# Patient Record
Sex: Female | Born: 1950 | Race: Black or African American | Hispanic: No | Marital: Single | Smoking: Never smoker
Health system: Southern US, Community
[De-identification: ages and names within clinical notes are randomized; demographics above are authoritative.]

## PROBLEM LIST (undated history)

## (undated) DIAGNOSIS — M1990 Unspecified osteoarthritis, unspecified site: Secondary | ICD-10-CM

## (undated) DIAGNOSIS — G4730 Sleep apnea, unspecified: Secondary | ICD-10-CM

## (undated) DIAGNOSIS — E119 Type 2 diabetes mellitus without complications: Secondary | ICD-10-CM

## (undated) DIAGNOSIS — R0683 Snoring: Secondary | ICD-10-CM

## (undated) DIAGNOSIS — H269 Unspecified cataract: Secondary | ICD-10-CM

## (undated) DIAGNOSIS — R9389 Abnormal findings on diagnostic imaging of other specified body structures: Secondary | ICD-10-CM

## (undated) HISTORY — DX: Unspecified osteoarthritis, unspecified site: M19.90

## (undated) HISTORY — DX: Unspecified cataract: H26.9

## (undated) HISTORY — DX: Snoring: R06.83

## (undated) HISTORY — DX: Sleep apnea, unspecified: G47.30

---

## 1982-06-06 HISTORY — PX: OTHER SURGICAL HISTORY: SHX169

## 2016-03-24 ENCOUNTER — Encounter (HOSPITAL_COMMUNITY): Payer: Self-pay | Admitting: Family Medicine

## 2016-03-24 ENCOUNTER — Ambulatory Visit (HOSPITAL_COMMUNITY)
Admission: EM | Admit: 2016-03-24 | Discharge: 2016-03-24 | Disposition: A | Discharge status: Home / Self Care | Payer: Medicare Other | Attending: Family Medicine | Admitting: Family Medicine

## 2016-03-24 DIAGNOSIS — K5901 Slow transit constipation: Secondary | ICD-10-CM | POA: Diagnosis present

## 2016-03-24 MED ORDER — POLYETHYLENE GLYCOL 3350 17 GM/SCOOP PO POWD: 1.0000 | Freq: Three times a day (TID) | ORAL | 0 refills | Status: DC | PRN

## 2016-03-24 NOTE — ED Triage Notes (Signed)
Pt here for pain in lower abdomen and las normal BM was Sunday. sts that when she sits down to use the bathroom air comes out and small amount of stool.

## 2016-03-24 NOTE — ED Provider Notes (Signed)
[place]    CSN: [id number] Arrival date & time: 03/24/16  1310     History   Chief Complaint Chief Complaint  Patient presents with  . Abdominal Pain  . Constipation    HPI Abigail Houston is a 65 y.o. female.   This is 65 year old woman who presents with lower abdominal pain and constipation. She recently moved into town. She works part-time doing data entry. She is due for colonoscopy.  Patient is having crampy lower abdominal pain and is passing stool and gas. Nevertheless she feels like she needs to go more.      History reviewed. No pertinent past medical history.  Patient Active Problem List   Diagnosis Date Noted  . Slow transit constipation 03/24/2016    History reviewed. No pertinent surgical history.  OB History    No data available       Home Medications    Prior to Admission medications   Medication Sig Start Date End Date Taking? Authorizing Provider  metFORMIN (GLUCOPHAGE) 500 MG tablet Take by mouth 2 (two) times daily with a meal.   Yes Historical Provider, MD  polyethylene glycol powder (MIRALAX) powder Take 255 g by mouth 3 (three) times daily as needed. 03/24/16   Robyn Haber, MD    Family History History reviewed. No pertinent family history.  Social History Social History  Substance Use Topics  . Smoking status: Never Smoker  . Smokeless tobacco: Never Used  . Alcohol use Not on file     Allergies   Review of patient's allergies indicates no known allergies.   Review of Systems Review of Systems  Constitutional: Negative.   HENT: Negative.   Respiratory: Negative.   Cardiovascular: Negative.   Gastrointestinal: Positive for abdominal pain and constipation. Negative for anal bleeding, blood in stool, diarrhea and vomiting.  Genitourinary: Negative.      Physical Exam Triage Vital Signs ED Triage Vitals  Enc Vitals Group     BP 03/24/16 1424 126/71     Pulse Rate 03/24/16 1424 79     Resp 03/24/16  1424 12     Temp 03/24/16 1424 98.5 F (36.9 C)     Temp Source 03/24/16 1424 Oral     SpO2 03/24/16 1424 100 %     Weight --      Height --      Head Circumference --      Peak Flow --      Pain Score 03/24/16 1438 9     Pain Loc --      Pain Edu? --      Excl. in [place]? --    No data found.   Updated Vital Signs BP 126/71 (BP Location: Left Arm)   Pulse 79   Temp 98.5 F (36.9 C) (Oral)   Resp 12   SpO2 100%      Physical Exam  Constitutional: She is oriented to person, place, and time. She appears well-developed and well-nourished.  HENT:  Head: Normocephalic.  Eyes: Conjunctivae are normal. Pupils are equal, round, and reactive to light.  Neck: Normal range of motion. Neck supple.  Abdominal: Soft. Bowel sounds are normal. She exhibits no distension and no mass. There is no tenderness. There is no guarding.  Musculoskeletal: Normal range of motion.  Neurological: She is alert and oriented to person, place, and time.  Skin: Skin is warm and dry.  Nursing note and vitals reviewed.    UC Treatments / Results  Labs (all  labs ordered are listed, but only abnormal results are displayed) Labs Reviewed - No data to display  EKG  EKG Interpretation None       Radiology No results found.  Procedures Procedures (including critical care time)  Medications Ordered in UC Medications - No data to display   Initial Impression / Assessment and Plan / UC Course  I have reviewed the triage vital signs and the nursing notes.  Pertinent labs & imaging results that were available during my care of the patient were reviewed by me and considered in my medical decision making (see chart for details).  Clinical Course    Final Clinical Impressions(s) / UC Diagnoses   Final diagnoses:  Slow transit constipation    New Prescriptions New Prescriptions   POLYETHYLENE GLYCOL POWDER (MIRALAX) POWDER    Take 255 g by mouth 3 (three) times daily as needed.     Robyn Haber, MD 03/24/16 [phone]

## 2016-06-14 ENCOUNTER — Ambulatory Visit (INDEPENDENT_AMBULATORY_CARE_PROVIDER_SITE_OTHER): Payer: Medicare Other

## 2016-06-14 ENCOUNTER — Encounter (HOSPITAL_COMMUNITY): Payer: Self-pay | Admitting: Family Medicine

## 2016-06-14 ENCOUNTER — Ambulatory Visit (HOSPITAL_COMMUNITY)
Admission: EM | Admit: 2016-06-14 | Discharge: 2016-06-14 | Disposition: A | Discharge status: Home / Self Care | Payer: 59 | Attending: Family Medicine | Admitting: Family Medicine

## 2016-06-14 DIAGNOSIS — M25531 Pain in right wrist: Secondary | ICD-10-CM

## 2016-06-14 DIAGNOSIS — M545 Low back pain, unspecified: Secondary | ICD-10-CM

## 2016-06-14 HISTORY — DX: Type 2 diabetes mellitus without complications: E11.9

## 2016-06-14 MED ORDER — METHOCARBAMOL 500 MG PO TABS: 500.0000 mg | ORAL_TABLET | Freq: Two times a day (BID) | ORAL | 0 refills | Status: DC
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
## 2016-06-14 NOTE — Discharge Instructions (Signed)
Your Xrays do not show any acute findings in your wrist. I am writing a prescription for pain medicine called robaxin. Take twice a day. Use ice, rest the affected areas, and elevate your wrist.  Should your symptoms not improve, I recommend you follow up with a primary care provider in one week for further evaluation. You may also take OTC tylenol in addition to what I have prescribed ever 4-6 hours as needed for additional pain control.

## 2016-06-28 ENCOUNTER — Encounter: Payer: Self-pay | Admitting: Vascular Surgery

## 2016-07-05 ENCOUNTER — Ambulatory Visit (INDEPENDENT_AMBULATORY_CARE_PROVIDER_SITE_OTHER): Payer: 59 | Admitting: Vascular Surgery

## 2016-07-05 ENCOUNTER — Encounter: Payer: Self-pay | Admitting: Vascular Surgery

## 2016-07-05 VITALS — BP 124/82 | HR 97 | Temp 97.6°F | Resp 16 | Ht 63.0 in | Wt 204.0 lb

## 2016-07-05 DIAGNOSIS — I83893 Varicose veins of bilateral lower extremities with other complications: Secondary | ICD-10-CM | POA: Diagnosis not present

## 2016-07-05 NOTE — Progress Notes (Signed)
Subjective:     Patient ID: Abigail Houston, female   DOB: 1950-11-23, 66 y.o.   MRN: [id number]  HPI This 66 year old female was referred by Gwenlyn Perking nurse practitioner for bilateral varicose veins with significant skin changes bilaterally. She has no history of previous treatment of her venous disease. He developed painful "lumps" in the right and left lower legs above the ankles. This has remained more tender on the right than the left but this has not resolved after 6 months. She has no history of DVT thrombophlebitis stasis ulcers or bleeding. She does not develop much swelling in the ankle as the day progresses. She does not were elastic compression stockings.  Past Medical History:  Diagnosis Date  . Diabetes mellitus without complication [facility])     Social History  Substance Use Topics  . Smoking status: Never Smoker  . Smokeless tobacco: Never Used  . Alcohol use Not on file    History reviewed. No pertinent family history.  Allergies  Allergen Reactions  . Ibuprofen      Current Outpatient Prescriptions:  .  metFORMIN (GLUCOPHAGE) 500 MG tablet, Take by mouth 2 (two) times daily with a meal., Disp: , Rfl:  .  methocarbamol (ROBAXIN) 500 MG tablet, Take 1 tablet (500 mg total) by mouth 2 (two) times daily., Disp: 20 tablet, Rfl: 0 .  polyethylene glycol powder (MIRALAX) powder, Take 255 g by mouth 3 (three) times daily as needed., Disp: 255 g, Rfl: 0  Vitals:   07/05/16 1527  BP: 124/82  Pulse: 97  Resp: 16  Temp: 97.6 F (36.4 C)  SpO2: 100%  Weight: 204 lb (92.5 kg)  Height: 5\' 3"  (1.6 m)    Body mass index is 36.14 kg/m.         Review of Systems Chest pain, dyspnea on exertion, PND, orthopnea, hemoptysis. Patient does have type 2 diabetes mellitus.    Objective:   Physical Exam BP 124/82 (Patient Position: Sitting)   Pulse 97   Temp 97.6 F (36.4 C)   Resp 16   Ht 5\' 3"  (1.6 m)   Wt 204 lb (92.5 kg)   SpO2 100%   BMI 36.14 kg/m      Gen.-alert and oriented x3 in no apparent distress HEENT normal for age Lungs no rhonchi or wheezing Cardiovascular regular rhythm no murmurs carotid pulses 3+ palpable no bruits audible Abdomen soft nontender no palpable masses-obese Musculoskeletal free of  major deformities Skin clear -no rashes Neurologic normal Lower extremities 3+ femoral and dorsalis pedis pulses palpable bilaterally with no edema Right leg with bulging varicosities posterior calf. What appears to be a thrombosed varicosity about 6 cm proximal to right medial malleolus with some darkened skin overlying this. Left leg with bulging varicosities posterio-lateral  lateral thigh extending down the popliteal space. Smaller varix left leg 6 or 8 cm proximal to medial malleolus which appears thrombosed similar to right side but smaller and less tender.  Today I performed a bedside SonoSite ultrasound exam. It appears that the great saphenous veins are normal in size with no obvious reflux although exam was difficult Unclear about small saphenous veins. There are some superficial veins which are large caliber which could represent the small saphenous vein       Assessment:     Painful varicosities posterior calf on the right and posterior lateral thigh on the left and evidence of previous thrombophlebitis lower third both legs    Plan:         #  1 long leg elastic compression stockings 20-30 mm gradient #2 elevate legs as much as possible #3 ibuprofen daily on a regular basis for pain #4 return in 3 months-formal venous reflux exam will be performed upon return and formal recommendations will be made at that time.

## 2016-07-07 NOTE — Addendum Note (Signed)
Addended by: Lianne Cure A on: 07/07/2016 01:40 PM   Modules accepted: Orders

## 2016-08-02 ENCOUNTER — Encounter: Payer: Self-pay | Admitting: Gastroenterology

## 2016-09-05 ENCOUNTER — Ambulatory Visit: Payer: 59 | Admitting: Obstetrics & Gynecology

## 2016-09-16 ENCOUNTER — Ambulatory Visit (AMBULATORY_SURGERY_CENTER): Payer: Self-pay

## 2016-09-16 VITALS — Ht 63.0 in | Wt 198.0 lb

## 2016-09-16 DIAGNOSIS — Z1211 Encounter for screening for malignant neoplasm of colon: Secondary | ICD-10-CM

## 2016-09-16 MED ORDER — SUPREP BOWEL PREP KIT 17.5-3.13-1.6 GM/177ML PO SOLN: 1.0000 | Freq: Once | ORAL | 0 refills | Status: AC

## 2016-09-16 NOTE — Progress Notes (Signed)
No allergies to eggs or soy No diet meds No home oxygen No past problems with anesthesia  Registered emmi 

## 2016-09-22 ENCOUNTER — Other Ambulatory Visit: Payer: Self-pay | Admitting: Obstetrics

## 2016-09-22 ENCOUNTER — Other Ambulatory Visit (HOSPITAL_COMMUNITY)
Admission: RE | Admit: 2016-09-22 | Discharge: 2016-09-22 | Disposition: A | Discharge status: Home / Self Care | Payer: Medicare (Managed Care) | Source: Ambulatory Visit | Attending: Obstetrics | Admitting: Obstetrics

## 2016-09-22 ENCOUNTER — Ambulatory Visit (INDEPENDENT_AMBULATORY_CARE_PROVIDER_SITE_OTHER): Payer: 59 | Admitting: Obstetrics

## 2016-09-22 ENCOUNTER — Encounter: Payer: Self-pay | Admitting: Obstetrics

## 2016-09-22 VITALS — BP 124/80 | HR 72 | Wt 201.0 lb

## 2016-09-22 DIAGNOSIS — Z124 Encounter for screening for malignant neoplasm of cervix: Secondary | ICD-10-CM

## 2016-09-22 DIAGNOSIS — Z1231 Encounter for screening mammogram for malignant neoplasm of breast: Secondary | ICD-10-CM

## 2016-09-22 DIAGNOSIS — Z01419 Encounter for gynecological examination (general) (routine) without abnormal findings: Secondary | ICD-10-CM

## 2016-09-22 DIAGNOSIS — Z780 Asymptomatic menopausal state: Secondary | ICD-10-CM

## 2016-09-22 DIAGNOSIS — E2839 Other primary ovarian failure: Secondary | ICD-10-CM

## 2016-09-22 NOTE — Progress Notes (Signed)
Subjective:        Abigail Houston is a 66 y.o. female here for a routine exam.  Current complaints: None.    Personal health questionnaire:  Is patient Ashkenazi Jewish, have a family history of breast and/or ovarian cancer: no Is there a family history of uterine cancer diagnosed at age < 16, gastrointestinal cancer, urinary tract cancer, family member who is a Field seismologist syndrome-associated carrier: no Is the patient overweight and hypertensive, family history of diabetes, personal history of gestational diabetes, preeclampsia or PCOS: no Is patient over 47, have PCOS,  family history of premature CHD under age 37, diabetes, smoke, have hypertension or peripheral artery disease:  no At any time, has a partner hit, kicked or otherwise hurt or frightened you?: no Over the past 2 weeks, have you felt down, depressed or hopeless?: no Over the past 2 weeks, have you felt little interest or pleasure in doing things?:no   Gynecologic History No LMP recorded. Patient is postmenopausal. Contraception: post menopausal status Last Pap: 2016. Results were: normal Last mammogram: 2017. Results were: normal  Obstetric History OB History  Gravida Para Term Preterm AB Living  6 5 5   1 2   SAB TAB Ectopic Multiple Live Births    1     3    # Outcome Date GA Lbr Len/2nd Weight Sex Delivery Anes PTL Lv  6 Term 02/28/83    F CS-LTranv   LIV  5 Term 11/05/75    F Vag-Spont     4 Term 09/22/73        FD  3 Term 08/16/72    F    LIV  2 Term 08/26/70    F Vag-Spont  Y ND  1 TAB               Past Medical History:  Diagnosis Date  . Arthritis   . Diabetes mellitus without complication [facility])     Past Surgical History:  Procedure Laterality Date  . arm fracture  1984   left  . CESAREAN SECTION       Current Outpatient Prescriptions:  .  metFORMIN (GLUCOPHAGE) 500 MG tablet, Take by mouth 2 (two) times daily with a meal., Disp: , Rfl:  Allergies  Allergen Reactions  . Ibuprofen     Social  History  Substance Use Topics  . Smoking status: Never Smoker  . Smokeless tobacco: Never Used  . Alcohol use Yes     Comment: occ    Family History  Problem Relation Age of Onset  . Colon cancer Neg Hx       Review of Systems  Constitutional: negative for fatigue and weight loss Respiratory: negative for cough and wheezing Cardiovascular: negative for chest pain, fatigue and palpitations Gastrointestinal: negative for abdominal pain and change in bowel habits Musculoskeletal:negative for myalgias Neurological: negative for gait problems and tremors Behavioral/Psych: negative for abusive relationship, depression Endocrine: negative for temperature intolerance    Genitourinary:negative for abnormal menstrual periods, genital lesions, hot flashes, sexual problems and vaginal discharge Integument/breast: negative for breast lump, breast tenderness, nipple discharge and skin lesion(s)    Objective:       BP 124/80   Pulse 72   Wt 201 lb (91.2 kg)   BMI 35.61 kg/m  General:   alert  Skin:   no rash or abnormalities  Lungs:   clear to auscultation bilaterally  Heart:   regular rate and rhythm, S1, S2 normal, no murmur, click, rub or  gallop  Breasts:   normal without suspicious masses, skin or nipple changes or axillary nodes  Abdomen:  normal findings: no organomegaly, soft, non-tender and no hernia  Pelvis:  External genitalia: normal general appearance Urinary system: urethral meatus normal and bladder without fullness, nontender Vaginal: normal without tenderness, induration or masses Cervix: normal appearance Adnexa: normal bimanual exam Uterus: anteverted and non-tender, normal size   Lab Review Urine pregnancy test Labs reviewed yes Radiologic studies reviewed yes  50% of 20 min visit spent on counseling and coordination of care.    Assessment:    Healthy female exam.    Postmenopausal.  Doing well.   Plan:   Bone density Study ordered  Education  reviewed: calcium supplements, depression evaluation, low fat, low cholesterol diet, self breast exams and weight bearing exercise. Mammogram ordered. Follow up in: 2 years.   No orders of the defined types were placed in this encounter.  No orders of the defined types were placed in this encounter.  Need to obtain previous records    Patient ID: Lailie Smead, female   DOB: 24-Feb-1951, 66 y.o.   MRN: [id number]

## 2016-09-22 NOTE — Patient Instructions (Addendum)
Health Maintenance, Female Adopting a healthy lifestyle and getting preventive care can go a long way to promote health and wellness. Talk with your health care provider about what schedule of regular examinations is right for you. This is a good chance for you to check in with your provider about disease prevention and staying healthy. In between checkups, there are plenty of things you can do on your own. Experts have done a lot of research about which lifestyle changes and preventive measures are most likely to keep you healthy. Ask your health care provider for more information. Weight and diet Eat a healthy diet  Be sure to include plenty of vegetables, fruits, low-fat dairy products, and lean protein.  Do not eat a lot of foods high in solid fats, added sugars, or salt.  Get regular exercise. This is one of the most important things you can do for your health.  Most adults should exercise for at least 150 minutes each week. The exercise should increase your heart rate and make you sweat (moderate-intensity exercise).  Most adults should also do strengthening exercises at least twice a week. This is in addition to the moderate-intensity exercise. Maintain a healthy weight  Body mass index (BMI) is a measurement that can be used to identify possible weight problems. It estimates body fat based on height and weight. Your health care provider can help determine your BMI and help you achieve or maintain a healthy weight.  For females 66 years of age and older:  A BMI below 18.5 is considered underweight.  A BMI of 18.5 to 24.9 is normal.  A BMI of 25 to 29.9 is considered overweight.  A BMI of 30 and above is considered obese. Watch levels of cholesterol and blood lipids  You should start having your blood tested for lipids and cholesterol at 66 years of age, then have this test every 5 years.  You may need to have your cholesterol levels checked more often if:  Your lipid or  cholesterol levels are high.  You are older than 66 years of age.  You are at high risk for heart disease. Cancer screening Lung Cancer  Lung cancer screening is recommended for adults 66-42 years old who are at high risk for lung cancer because of a history of smoking.  A yearly low-dose CT scan of the lungs is recommended for people who:  Currently smoke.  Have quit within the past 15 years.  Have at least a 30-pack-year history of smoking. A pack year is smoking an average of one pack of cigarettes a day for 1 year.  Yearly screening should continue until it has been 15 years since you quit.  Yearly screening should stop if you develop a health problem that would prevent you from having lung cancer treatment. Breast Cancer  Practice breast self-awareness. This means understanding how your breasts normally appear and feel.  It also means doing regular breast self-exams. Let your health care provider know about any changes, no matter how small.  If you are in your 66s or 30s, you should have a clinical breast exam (CBE) by a health care provider every 1-3 years as part of a regular health exam.  If you are 66 or older, have a CBE every year. Also consider having a breast X-ray (mammogram) every year.  If you have a family history of breast cancer, talk to your health care provider about genetic screening.  If you are at high risk for breast cancer, talk  to your health care provider about having an MRI and a mammogram every year.  Breast cancer gene (BRCA) assessment is recommended for women who have family members with BRCA-related cancers. BRCA-related cancers include:  Breast.  Ovarian.  Tubal.  Peritoneal cancers.  Results of the assessment will determine the need for genetic counseling and BRCA1 and BRCA2 testing. Cervical Cancer  Your health care provider may recommend that you be screened regularly for cancer of the pelvic organs (ovaries, uterus, and vagina).  This screening involves a pelvic examination, including checking for microscopic changes to the surface of your cervix (Pap test). You may be encouraged to have this screening done every 3 years, beginning at age 66.  For women ages 66-66, health care providers may recommend pelvic exams and Pap testing every 3 years, or they may recommend the Pap and pelvic exam, combined with testing for human papilloma virus (HPV), every 5 years. Some types of HPV increase your risk of cervical cancer. Testing for HPV may also be done on women of any age with unclear Pap test results.  Other health care providers may not recommend any screening for nonpregnant women who are considered low risk for pelvic cancer and who do not have symptoms. Ask your health care provider if a screening pelvic exam is right for you.  If you have had past treatment for cervical cancer or a condition that could lead to cancer, you need Pap tests and screening for cancer for at least 20 years after your treatment. If Pap tests have been discontinued, your risk factors (such as having a new sexual partner) need to be reassessed to determine if screening should resume. Some women have medical problems that increase the chance of getting cervical cancer. In these cases, your health care provider may recommend more frequent screening and Pap tests. Colorectal Cancer  This type of cancer can be detected and often prevented.  Routine colorectal cancer screening usually begins at 66 years of age and continues through 66 years of age.  Your health care provider may recommend screening at an earlier age if you have risk factors for colon cancer.  Your health care provider may also recommend using home test kits to check for hidden blood in the stool.  A small camera at the end of a tube can be used to examine your colon directly (sigmoidoscopy or colonoscopy). This is done to check for the earliest forms of colorectal cancer.  Routine  screening usually begins at age 66.  Direct examination of the colon should be repeated every 5-10 years through 66 years of age. However, you may need to be screened more often if early forms of precancerous polyps or small growths are found. Skin Cancer  Check your skin from head to toe regularly.  Tell your health care provider about any new moles or changes in moles, especially if there is a change in a mole's shape or color.  Also tell your health care provider if you have a mole that is larger than the size of a pencil eraser.  Always use sunscreen. Apply sunscreen liberally and repeatedly throughout the day.  Protect yourself by wearing long sleeves, pants, a wide-brimmed hat, and sunglasses whenever you are outside. Heart disease, diabetes, and high blood pressure  High blood pressure causes heart disease and increases the risk of stroke. High blood pressure is more likely to develop in:  People who have blood pressure in the high end of the normal range (130-139/85-89 mm Hg).  People who are overweight or obese.  People who are African American.  If you are 59-24 years of age, have your blood pressure checked every 3-5 years. If you are 34 years of age or older, have your blood pressure checked every year. You should have your blood pressure measured twice-once when you are at a hospital or clinic, and once when you are not at a hospital or clinic. Record the average of the two measurements. To check your blood pressure when you are not at a hospital or clinic, you can use:  An automated blood pressure machine at a pharmacy.  A home blood pressure monitor.  If you are between 29 years and 60 years old, ask your health care provider if you should take aspirin to prevent strokes.  Have regular diabetes screenings. This involves taking a blood sample to check your fasting blood sugar level.  If you are at a normal weight and have a low risk for diabetes, have this test once  every three years after 66 years of age.  If you are overweight and have a high risk for diabetes, consider being tested at a younger age or more often. Preventing infection Hepatitis B  If you have a higher risk for hepatitis B, you should be screened for this virus. You are considered at high risk for hepatitis B if:  You were born in a country where hepatitis B is common. Ask your health care provider which countries are considered high risk.  Your parents were born in a high-risk country, and you have not been immunized against hepatitis B (hepatitis B vaccine).  You have HIV or AIDS.  You use needles to inject street drugs.  You live with someone who has hepatitis B.  You have had sex with someone who has hepatitis B.  You get hemodialysis treatment.  You take certain medicines for conditions, including cancer, organ transplantation, and autoimmune conditions. Hepatitis C  Blood testing is recommended for:  Everyone born from [birth year] through 1965.  Anyone with known risk factors for hepatitis C. Sexually transmitted infections (STIs)  You should be screened for sexually transmitted infections (STIs) including gonorrhea and chlamydia if:  You are sexually active and are younger than 66 years of age.  You are older than 66 years of age and your health care provider tells you that you are at risk for this type of infection.  Your sexual activity has changed since you were last screened and you are at an increased risk for chlamydia or gonorrhea. Ask your health care provider if you are at risk.  If you do not have HIV, but are at risk, it may be recommended that you take a prescription medicine daily to prevent HIV infection. This is called pre-exposure prophylaxis (PrEP). You are considered at risk if:  You are sexually active and do not regularly use condoms or know the HIV status of your partner(s).  You take drugs by injection.  You are sexually active with a partner  who has HIV. Talk with your health care provider about whether you are at high risk of being infected with HIV. If you choose to begin PrEP, you should first be tested for HIV. You should then be tested every 3 months for as long as you are taking PrEP. Pregnancy  If you are premenopausal and you may become pregnant, ask your health care provider about preconception counseling.  If you may become pregnant, take 400 to 800 micrograms (mcg) of folic acid  every day.  If you want to prevent pregnancy, talk to your health care provider about birth control (contraception). Osteoporosis and menopause  Osteoporosis is a disease in which the bones lose minerals and strength with aging. This can result in serious bone fractures. Your risk for osteoporosis can be identified using a bone density scan.  If you are 98 years of age or older, or if you are at risk for osteoporosis and fractures, ask your health care provider if you should be screened.  Ask your health care provider whether you should take a calcium or vitamin D supplement to lower your risk for osteoporosis.  Menopause may have certain physical symptoms and risks.  Hormone replacement therapy may reduce some of these symptoms and risks. Talk to your health care provider about whether hormone replacement therapy is right for you. Follow these instructions at home:  Schedule regular health, dental, and eye exams.  Stay current with your immunizations.  Do not use any tobacco products including cigarettes, chewing tobacco, or electronic cigarettes.  If you are pregnant, do not drink alcohol.  If you are breastfeeding, limit how much and how often you drink alcohol.  Limit alcohol intake to no more than 1 drink per day for nonpregnant women. One drink equals 12 ounces of beer, 5 ounces of wine, or 1 ounces of hard liquor.  Do not use street drugs.  Do not share needles.  Ask your health care provider for help if you need support  or information about quitting drugs.  Tell your health care provider if you often feel depressed.  Tell your health care provider if you have ever been abused or do not feel safe at home. This information is not intended to replace advice given to you by your health care provider. Make sure you discuss any questions you have with your health care provider. Document Released: 12/06/2010 Document Revised: 10/29/2015 Document Reviewed: 02/24/2015 Elsevier Interactive Patient Education  2017 [employer].  Preventive Care 65 Years and Older, Female Preventive care refers to lifestyle choices and visits with your health care provider that can promote health and wellness. What does preventive care include?  A yearly physical exam. This is also called an annual well check.  Dental exams once or twice a year.  Routine eye exams. Ask your health care provider how often you should have your eyes checked.  Personal lifestyle choices, including:  Daily care of your teeth and gums.  Regular physical activity.  Eating a healthy diet.  Avoiding tobacco and drug use.  Limiting alcohol use.  Practicing safe sex.  Taking low-dose aspirin every day.  Taking vitamin and mineral supplements as recommended by your health care provider. What happens during an annual well check? The services and screenings done by your health care provider during your annual well check will depend on your age, overall health, lifestyle risk factors, and family history of disease. Counseling  Your health care provider may ask you questions about your:  Alcohol use.  Tobacco use.  Drug use.  Emotional well-being.  Home and relationship well-being.  Sexual activity.  Eating habits.  History of falls.  Memory and ability to understand (cognition).  Work and work Astronomer.  Reproductive health. Screening  You may have the following tests or measurements:  Height, weight, and BMI.  Blood  pressure.  Lipid and cholesterol levels. These may be checked every 5 years, or more frequently if you are over 36 years old.  Skin check.  Lung cancer  screening. You may have this screening every year starting at age 65 if you have a 30-pack-year history of smoking and currently smoke or have quit within the past 15 years.  Fecal occult blood test (FOBT) of the stool. You may have this test every year starting at age 71.  Flexible sigmoidoscopy or colonoscopy. You may have a sigmoidoscopy every 5 years or a colonoscopy every 10 years starting at age 51.  Hepatitis C blood test.  Hepatitis B blood test.  Sexually transmitted disease (STD) testing.  Diabetes screening. This is done by checking your blood sugar (glucose) after you have not eaten for a while (fasting). You may have this done every 1-3 years.  Bone density scan. This is done to screen for osteoporosis. You may have this done starting at age 62.  Mammogram. This may be done every 1-2 years. Talk to your health care provider about how often you should have regular mammograms. Talk with your health care provider about your test results, treatment options, and if necessary, the need for more tests. Vaccines  Your health care provider may recommend certain vaccines, such as:  Influenza vaccine. This is recommended every year.  Tetanus, diphtheria, and acellular pertussis (Tdap, Td) vaccine. You may need a Td booster every 10 years.  Varicella vaccine. You may need this if you have not been vaccinated.  Zoster vaccine. You may need this after age 60.  Measles, mumps, and rubella (MMR) vaccine. You may need at least one dose of MMR if you were born in 1957 or later. You may also need a second dose.  Pneumococcal 13-valent conjugate (PCV13) vaccine. One dose is recommended after age 96.  Pneumococcal polysaccharide (PPSV23) vaccine. One dose is recommended after age 89.  Meningococcal vaccine. You may need this if you  have certain conditions.  Hepatitis A vaccine. You may need this if you have certain conditions or if you travel or work in places where you may be exposed to hepatitis A.  Hepatitis B vaccine. You may need this if you have certain conditions or if you travel or work in places where you may be exposed to hepatitis B.  Haemophilus influenzae type b (Hib) vaccine. You may need this if you have certain conditions. Talk to your health care provider about which screenings and vaccines you need and how often you need them. This information is not intended to replace advice given to you by your health care provider. Make sure you discuss any questions you have with your health care provider. Document Released: 06/19/2015 Document Revised: 02/10/2016 Document Reviewed: 03/24/2015 Elsevier Interactive Patient Education  2017 [employer].

## 2016-09-23 LAB — CYTOLOGY - PAP
Diagnosis: NEGATIVE
HPV (WINDOPATH): NOT DETECTED

## 2016-09-23 LAB — CERVICOVAGINAL ANCILLARY ONLY
Bacterial vaginitis: NEGATIVE
Candida vaginitis: NEGATIVE

## 2016-09-27 ENCOUNTER — Encounter: Payer: Self-pay | Admitting: Vascular Surgery

## 2016-09-30 ENCOUNTER — Encounter: Payer: 59 | Admitting: Gastroenterology

## 2016-10-04 ENCOUNTER — Encounter: Payer: Self-pay | Admitting: Vascular Surgery

## 2016-10-04 ENCOUNTER — Ambulatory Visit (HOSPITAL_COMMUNITY)
Admission: RE | Admit: 2016-10-04 | Discharge: 2016-10-04 | Disposition: A | Discharge status: Home / Self Care | Payer: Medicare Other | Source: Ambulatory Visit | Attending: Vascular Surgery | Admitting: Vascular Surgery

## 2016-10-04 ENCOUNTER — Ambulatory Visit (INDEPENDENT_AMBULATORY_CARE_PROVIDER_SITE_OTHER): Payer: 59 | Admitting: Vascular Surgery

## 2016-10-04 VITALS — BP 126/80 | HR 85 | Temp 98.7°F | Resp 18 | Ht 63.0 in | Wt 201.0 lb

## 2016-10-04 DIAGNOSIS — I872 Venous insufficiency (chronic) (peripheral): Secondary | ICD-10-CM | POA: Insufficient documentation

## 2016-10-04 DIAGNOSIS — I83893 Varicose veins of bilateral lower extremities with other complications: Secondary | ICD-10-CM

## 2016-10-04 NOTE — Progress Notes (Signed)
Subjective:     Patient ID: Abigail Houston, female   DOB: 1950/07/04, 66 y.o.   MRN: [id number]  HPI This 66 year old female returns for continued follow-up regarding her bilateral varicose veins. She continues to have discomfort in the ankle areas right worse than left. She has developed some mild swelling on the right side and she has no history of DVT. She has tried long-leg elastic compression stockings without help.  Past Medical History:  Diagnosis Date  . Arthritis   . Diabetes mellitus without complication [facility])     Social History  Substance Use Topics  . Smoking status: Never Smoker  . Smokeless tobacco: Never Used  . Alcohol use Yes     Comment: occ    Family History  Problem Relation Age of Onset  . Colon cancer Neg Hx     Allergies  Allergen Reactions  . Ibuprofen      Current Outpatient Prescriptions:  .  metFORMIN (GLUCOPHAGE) 500 MG tablet, Take by mouth 2 (two) times daily with a meal., Disp: , Rfl:   Vitals:   10/04/16 1624  Resp: 16    There is no height or weight on file to calculate BMI.        Review of Systems Denies chest pain, dyspnea on exertion, PND, orthopnea, hemoptysis    Objective:   Physical Exam Resp 16   Gen. obese female in no apparent distress alert and oriented 3 Lungs no rhonchi or wheezing Right lower extremity with 2+ dorsalis pedis pulse palpable. Evidence of resolving thrombophlebitis proximal to the right medial malleolus. Trace edema. 2+ dorsalis pedis pulse palpable. Left leg with some small varicosities proximal to medial malleolus posteriorly which are mildly tender. Trace edema.  Today a formal venous ultrasound was performed both lower extremities. Both great and small saphenous veins are normal in size with no evidence of significant reflux     Assessment:     Resolving thrombophlebitis bilateral lower extremities with no evidence of gross reflux in greater or small saphenous systems-no intervention  indicated    Plan:     1 elevate foot of bed #2 feet and anti-inflammatory medication for discomfort #3 short leg elastic compression stockings to be placed on first thing in the morning #4 return on a when necessary basis no indication for any intervention on vascular system

## 2016-10-21 ENCOUNTER — Ambulatory Visit: Admission: RE | Admit: 2016-10-21 | Payer: 59 | Source: Ambulatory Visit

## 2016-10-21 ENCOUNTER — Ambulatory Visit
Admission: RE | Admit: 2016-10-21 | Discharge: 2016-10-21 | Disposition: A | Discharge status: Home / Self Care | Payer: Medicare Other | Source: Ambulatory Visit | Attending: Obstetrics | Admitting: Obstetrics

## 2016-10-21 DIAGNOSIS — Z1231 Encounter for screening mammogram for malignant neoplasm of breast: Secondary | ICD-10-CM

## 2016-10-25 ENCOUNTER — Ambulatory Visit
Admission: RE | Admit: 2016-10-25 | Discharge: 2016-10-25 | Disposition: A | Discharge status: Home / Self Care | Payer: Medicare Other | Source: Ambulatory Visit | Attending: Obstetrics | Admitting: Obstetrics

## 2016-10-25 DIAGNOSIS — E2839 Other primary ovarian failure: Secondary | ICD-10-CM

## 2016-10-25 DIAGNOSIS — Z780 Asymptomatic menopausal state: Secondary | ICD-10-CM

## 2016-10-29 ENCOUNTER — Emergency Department (HOSPITAL_COMMUNITY): Payer: Medicare Other

## 2016-10-29 ENCOUNTER — Encounter (HOSPITAL_COMMUNITY): Payer: Self-pay | Admitting: Emergency Medicine

## 2016-10-29 ENCOUNTER — Emergency Department (HOSPITAL_COMMUNITY)
Admission: EM | Admit: 2016-10-29 | Discharge: 2016-10-29 | Disposition: A | Discharge status: Home / Self Care | Payer: Medicare Other | Attending: Emergency Medicine | Admitting: Emergency Medicine

## 2016-10-29 DIAGNOSIS — E119 Type 2 diabetes mellitus without complications: Secondary | ICD-10-CM | POA: Insufficient documentation

## 2016-10-29 DIAGNOSIS — Z7984 Long term (current) use of oral hypoglycemic drugs: Secondary | ICD-10-CM | POA: Insufficient documentation

## 2016-10-29 DIAGNOSIS — R42 Dizziness and giddiness: Secondary | ICD-10-CM | POA: Diagnosis present

## 2016-10-29 DIAGNOSIS — G459 Transient cerebral ischemic attack, unspecified: Secondary | ICD-10-CM

## 2016-10-29 LAB — BASIC METABOLIC PANEL
ANION GAP: 9 (ref 5–15)
BUN: 8 mg/dL (ref 6–20)
CHLORIDE: 108 mmol/L (ref 101–111)
CO2: 21 mmol/L — ABNORMAL LOW (ref 22–32)
Calcium: 8.6 mg/dL — ABNORMAL LOW (ref 8.9–10.3)
Creatinine, Ser: 0.64 mg/dL (ref 0.44–1.00)
Glucose, Bld: 139 mg/dL — ABNORMAL HIGH (ref 65–99)
POTASSIUM: 3.7 mmol/L (ref 3.5–5.1)
SODIUM: 138 mmol/L (ref 135–145)

## 2016-10-29 LAB — CBG MONITORING, ED: Glucose-Capillary: 136 mg/dL — ABNORMAL HIGH (ref 65–99)

## 2016-10-29 LAB — URINALYSIS, ROUTINE W REFLEX MICROSCOPIC
Bilirubin Urine: NEGATIVE
GLUCOSE, UA: NEGATIVE mg/dL
Hgb urine dipstick: NEGATIVE
KETONES UR: NEGATIVE mg/dL
LEUKOCYTES UA: NEGATIVE
NITRITE: NEGATIVE
PH: 7 (ref 5.0–8.0)
Protein, ur: NEGATIVE mg/dL
SPECIFIC GRAVITY, URINE: 1.01 (ref 1.005–1.030)

## 2016-10-29 LAB — CBC
HEMATOCRIT: 38.7 % (ref 36.0–46.0)
HEMOGLOBIN: 12.3 g/dL (ref 12.0–15.0)
MCH: 30.1 pg (ref 26.0–34.0)
MCHC: 31.8 g/dL (ref 30.0–36.0)
MCV: 94.6 fL (ref 78.0–100.0)
Platelets: 148 10*3/uL — ABNORMAL LOW (ref 150–400)
RBC: 4.09 MIL/uL (ref 3.87–5.11)
RDW: 13.9 % (ref 11.5–15.5)
WBC: 5.7 10*3/uL (ref 4.0–10.5)

## 2016-10-29 MED ORDER — LORAZEPAM 2 MG/ML IJ SOLN
0.5000 mg | Freq: Once | INTRAMUSCULAR | Status: AC
  Administered 2016-10-29: 0.5 mg via INTRAVENOUS
  Filled 2016-10-29: qty 1

## 2016-10-29 MED ORDER — MECLIZINE HCL 25 MG PO TABS: 25.0000 mg | ORAL_TABLET | Freq: Three times a day (TID) | ORAL | 0 refills | Status: DC | PRN

## 2016-10-29 NOTE — ED Notes (Signed)
0.5 mg Ativan wasted in [place], Eilene Ghazi RN

## 2016-10-29 NOTE — ED Triage Notes (Signed)
Pt arrives from home by [facility] for dizziness that started once she woke up this am and got up out of bed. Pt felt room was spinning while trying to walk to restroom. Pt states resolved with sitting still. Pt also states she had 4 episodes of diarrhea yesterday after eating some mayo.Pt has no dizziness at this time. Pt denies any pain. Pt reports also taking an antibiotic for a infection in lower leg.

## 2016-10-29 NOTE — ED Provider Notes (Signed)
[place] DEPT Provider Note   CSN: [id number] Arrival date & time: 10/29/16  6945     History   Chief Complaint Chief Complaint  Patient presents with  . Dizziness    HPI Virjean Boman is a 66 y.o. female.  HPI 66 year old female last known normal left side approximately midnight on awakening this morning she had vertigo. She states when she moves her head and things appeared to be moving. She had difficulty walking secondary to this. She has not had nausea or vomiting. She denies any visual changes, difficulty speaking, lateralized weakness. She has not had any similar symptoms in the past. She has taken no medication. Symptoms appear somewhat improved at this time. She denies any headache or head injury. She is not on blood thinners. She has no prior history of stroke or vertigo. Past Medical History:  Diagnosis Date  . Arthritis   . Diabetes mellitus without complication [facility])     Patient Active Problem List   Diagnosis Date Noted  . Varicose veins of bilateral lower extremities with other complications 03/88/8280  . Slow transit constipation 03/24/2016    Past Surgical History:  Procedure Laterality Date  . arm fracture  1984   left  . CESAREAN SECTION      OB History    Gravida Para Term Preterm AB Living   6 5 5   1 2    SAB TAB Ectopic Multiple Live Births     1     3       Home Medications    Prior to Admission medications   Medication Sig Start Date End Date Taking? Authorizing Provider  metFORMIN (GLUCOPHAGE) 500 MG tablet Take by mouth 2 (two) times daily with a meal.    [provider]    Family History Family History  Problem Relation Age of Onset  . Colon cancer Neg Hx     Social History Social History  Substance Use Topics  . Smoking status: Never Smoker  . Smokeless tobacco: Never Used  . Alcohol use Yes     Comment: occ     Allergies   Ibuprofen   Review of Systems Review of Systems  All other systems reviewed  and are negative.    Physical Exam Updated Vital Signs BP (!) 145/81   Pulse 65   Temp 98.6 F (37 C) (Oral)   Resp 15   SpO2 99%   Physical Exam  Constitutional: She is oriented to person, place, and time. She appears well-developed and well-nourished. No distress.  HENT:  Head: Normocephalic and atraumatic.  Right Ear: External ear normal.  Left Ear: External ear normal.  Nose: Nose normal.  Eyes: Conjunctivae and EOM are normal. Pupils are equal, round, and reactive to light.  Neck: Normal range of motion. Neck supple.  Cardiovascular: Normal rate, regular rhythm and normal heart sounds.   Pulmonary/Chest: Effort normal and breath sounds normal.  Abdominal: Soft. Bowel sounds are normal.  Musculoskeletal: Normal range of motion.  Neurological: She is alert and oriented to person, place, and time. She displays normal reflexes. No cranial nerve deficit. She exhibits normal muscle tone. Coordination normal.  Skin: Skin is warm and dry.  Psychiatric: She has a normal mood and affect. Her behavior is normal. Thought content normal.  Nursing note and vitals reviewed.    ED Treatments / Results  Labs (all labs ordered are listed, but only abnormal results are displayed) Labs Reviewed  CBG MONITORING, ED - Abnormal; Notable for the  following:       Result Value   Glucose-Capillary 136 (*)    All other components within normal limits  BASIC METABOLIC PANEL  CBC  URINALYSIS, ROUTINE W REFLEX MICROSCOPIC    EKG  EKG Interpretation None       Radiology No results found.  Procedures Procedures (including critical care time)  Medications Ordered in ED Medications - No data to display   Initial Impression / Assessment and Plan / ED Course  I have reviewed the triage vital signs and the nursing notes.  Pertinent labs & imaging results that were available during my care of the patient were reviewed by me and considered in my medical decision making (see chart for  details).    Patient remains stable here.  Labs and ct normal.  MRI obtained and no evidence of stroke.  Discussed treatment, follow up and return precautions and patient and family voice understanding.    Final Clinical Impressions(s) / ED Diagnoses   Final diagnoses:  Vertigo    New Prescriptions New Prescriptions   No medications on file     Pattricia Boss, MD 10/29/16 1434

## 2016-11-18 ENCOUNTER — Encounter: Payer: 59 | Admitting: Gastroenterology

## 2017-01-05 ENCOUNTER — Telehealth: Payer: Self-pay | Admitting: Gastroenterology

## 2017-01-06 ENCOUNTER — Encounter: Payer: Medicare Other | Admitting: Gastroenterology

## 2017-07-07 HISTORY — PX: EYE SURGERY: SHX253

## 2017-07-13 ENCOUNTER — Encounter: Payer: Self-pay | Admitting: Family Medicine

## 2017-09-12 ENCOUNTER — Encounter: Payer: Self-pay | Admitting: Gastroenterology

## 2017-10-05 ENCOUNTER — Other Ambulatory Visit: Payer: Self-pay | Admitting: Obstetrics

## 2017-10-05 DIAGNOSIS — Z1231 Encounter for screening mammogram for malignant neoplasm of breast: Secondary | ICD-10-CM

## 2017-10-25 ENCOUNTER — Ambulatory Visit
Admission: RE | Admit: 2017-10-25 | Discharge: 2017-10-25 | Disposition: A | Discharge status: Home / Self Care | Payer: Medicare Other | Source: Ambulatory Visit | Attending: Obstetrics | Admitting: Obstetrics

## 2017-10-25 DIAGNOSIS — Z1231 Encounter for screening mammogram for malignant neoplasm of breast: Secondary | ICD-10-CM

## 2017-11-04 HISTORY — PX: OTHER SURGICAL HISTORY: SHX169

## 2017-11-09 ENCOUNTER — Encounter: Payer: Self-pay | Admitting: Neurology

## 2017-11-13 ENCOUNTER — Institutional Professional Consult (permissible substitution): Payer: Medicare Other | Admitting: Neurology

## 2017-11-15 ENCOUNTER — Encounter: Payer: Medicare Other | Admitting: Gastroenterology

## 2018-01-01 ENCOUNTER — Telehealth: Payer: Self-pay

## 2018-01-01 NOTE — Telephone Encounter (Signed)
Patient no showed for PV. Patient was called and there was a mix up on the appointment time. Patient has rescheduled in the past and was looking at an appointment that she no showed for. Patient will be rescheduled or we will cancel her procedure.   Riki Sheer, LPN

## 2018-01-02 ENCOUNTER — Ambulatory Visit (AMBULATORY_SURGERY_CENTER): Payer: Self-pay

## 2018-01-02 VITALS — Ht 64.0 in | Wt 208.4 lb

## 2018-01-02 DIAGNOSIS — Z1211 Encounter for screening for malignant neoplasm of colon: Secondary | ICD-10-CM

## 2018-01-02 MED ORDER — NA SULFATE-K SULFATE-MG SULF 17.5-3.13-1.6 GM/177ML PO SOLN: 1.0000 | Freq: Once | ORAL | 0 refills | Status: AC

## 2018-01-02 NOTE — Progress Notes (Signed)
Per pt, no allergies to soy or egg products.Pt not taking any weight loss meds or using  O2 at home.  Emmi video sent to patient's email 

## 2018-01-04 ENCOUNTER — Telehealth: Payer: Self-pay | Admitting: Neurology

## 2018-01-04 NOTE — Telephone Encounter (Signed)
Pt has called and rescheduled her new patient sleep consult apt on 3 separate occasions. She is now scheduled 02/20/18 at 1:00 pm. If patient calls again to reschedule her NP apt please direct to [facility] in the sleep lab for future rescheduling. Thanks

## 2018-01-08 ENCOUNTER — Institutional Professional Consult (permissible substitution): Payer: Medicare Other | Admitting: Neurology

## 2018-01-15 ENCOUNTER — Encounter: Payer: Medicare HMO | Admitting: Gastroenterology

## 2018-01-18 ENCOUNTER — Encounter: Payer: Medicare HMO | Admitting: Gastroenterology

## 2018-02-20 ENCOUNTER — Ambulatory Visit (INDEPENDENT_AMBULATORY_CARE_PROVIDER_SITE_OTHER): Payer: Medicare HMO | Admitting: Neurology

## 2018-02-20 ENCOUNTER — Encounter: Payer: Self-pay | Admitting: Neurology

## 2018-02-20 VITALS — BP 113/80 | HR 80 | Ht 63.0 in | Wt 209.0 lb

## 2018-02-20 DIAGNOSIS — E669 Obesity, unspecified: Secondary | ICD-10-CM | POA: Diagnosis not present

## 2018-02-20 DIAGNOSIS — R0683 Snoring: Secondary | ICD-10-CM

## 2018-02-20 DIAGNOSIS — M25471 Effusion, right ankle: Secondary | ICD-10-CM

## 2018-02-20 DIAGNOSIS — M25472 Effusion, left ankle: Secondary | ICD-10-CM

## 2018-02-20 DIAGNOSIS — R351 Nocturia: Secondary | ICD-10-CM | POA: Insufficient documentation

## 2018-02-20 DIAGNOSIS — E1141 Type 2 diabetes mellitus with diabetic mononeuropathy: Secondary | ICD-10-CM | POA: Insufficient documentation

## 2018-02-20 NOTE — Progress Notes (Signed)
SLEEP MEDICINE CLINIC   Provider:  Larey Seat, M.D.   Primary Care Physician:  Bernerd Limbo, MD    Referring Provider: Dr. Coletta Memos, [place]    Chief Complaint  Patient presents with  . New Patient (Initial Visit)    pt alone, rm 10. pt states that she has apnea and she snores. she used to be on CPAP machine.last sleep study completed 6 years. she hasnt used the machine in the last 5 years. she wakes up frequently during the night void.    HPI:  Abigail Houston is a 67 y.o. female patient of [place] ( [place]) who is seen here finally on 02-20-2018 on her three times rescheduled appointment. She is to be seen upon referral by Dr. Coletta Memos, MD.  Abigail Houston is seen here as a new patient referred by Dr. Bernerd Limbo, she is left handed, single and semi retired.She was diagnosed with OSA in a PSG in [place] , [place] about 5-6 years ago, and started on CPAP.  She became irritated with a nasal pillow , and with the bulky size of the CPAP - but it helped with the snoring. She discontinued it anyway after about 1 year. There is a history of snoring, type 2 diabetes mellitus, and ankle edema - treated with furosemide. She reports muscle cramping. She is taking the medication at night (!) , but is aware of the need to take potassium rich foods. Chief complaint according to patient : I am here because my doctor is concerned-  " I don't wake from my snoring- and I sleep alone."   Sleep habits are as follows: She wakes by alarm at 6.15, work began at 8.AM but she is now retired.  She walks in the morning 2-1/2 miles 3 times a week.  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  The dinnertime is around 7-8 Pm, depending on how busy her day was.  She usually spends the evening at home, watching TV.  Her bedtime will be around 11 PM to midnight, her bedroom is described as comfortable, cool enough, dark and quiet and off.  She has no trouble falling asleep. She sleeps on her side, on 2 pillows.  There is another pillow  for body position and to prevent swelling by elevating her legs. 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  She reports nocturia 3 times plus. She can sleep at least 2 hours en bloc before her first bathroom break.  Her inner clock still wakes her up at 615.  She will average less than 6 hours of nocturnal sleep, Somewhere between 5 and 6 hours. Some days she may have 7 hours of sleep.   Sleep medical history and family sleep history:  No known family medical history, no sleep walking history, no seizures.   Social history:  Single, non- smoker, ETOH - socially- , caffeine- 2 cups of hot tea a day. She drank coffee at work , but not since June 2019  One in AM and one in PM. Soda or iced tea  Some days.   Review of Systems: Out of a complete 14 system review, the patient complains of only the following symptoms, and all other reviewed systems are negative.  Right eye cataract surgery in 07-2017,  Not enough time to sleep- no naps.  Ankle edema, nocturia, muscle cramping, dry mouth/  Epworth score 7 , Fatigue severity score 16  , depression score 0/15    Social History  Socioeconomic History  . Marital status: Single    Spouse name: Not on file  . Number of children: Not on file  . Years of education: Not on file  . Highest education level: Not on file  Occupational History  . Not on file  Social Needs  . Financial resource strain: Not on file  . Food insecurity:    Worry: Not on file    Inability: Not on file  . Transportation needs:    Medical: Not on file    Non-medical: Not on file  Tobacco Use  . Smoking status: Never Smoker  . Smokeless tobacco: Never Used  Substance and Sexual Activity  . Alcohol use: Yes    Comment: occasional  . Drug use: No  . Sexual activity: Not on file  Lifestyle  . Physical activity:    Days per week: Not on file    Minutes per session: Not on file  . Stress: Not on file  Relationships  .  Social connections:    Talks on phone: Not on file    Gets together: Not on file    Attends religious service: Not on file    Active member of club or organization: Not on file    Attends meetings of clubs or organizations: Not on file    Relationship status: Not on file  . Intimate partner violence:    Fear of current or ex partner: Not on file    Emotionally abused: Not on file    Physically abused: Not on file    Forced sexual activity: Not on file  Other Topics Concern  . Not on file  Social History Narrative  . Not on file    Family History  Problem Relation Age of Onset  . Ovarian cancer Mother   . Diabetes Sister   . Heart disease Brother   . Colon cancer Neg Hx     Past Medical History:  Diagnosis Date  . Arthritis   . Cataract   . Diabetes mellitus without complication ([place])   . Snoring     Past Surgical History:  Procedure Laterality Date  . arm fracture  1984   left  . CESAREAN SECTION     one time  . EYE SURGERY  07/2017   right eye cataract surgery  . varicose veins  11/2017   left leg    Current Outpatient Medications  Medication Sig Dispense Refill  . aspirin EC 81 MG tablet Take 81 mg by mouth daily.    . cholecalciferol (VITAMIN D) 1000 units tablet Take 1,000 Units by mouth daily.    . furosemide (LASIX) 20 MG tablet Take by mouth.    . metFORMIN (GLUCOPHAGE) 500 MG tablet Take by mouth. Take 2 pills in am and one pill at night    . Multiple Vitamins-Minerals (CENTRUM SILVER PO) Take by mouth daily.    . Omega-3 Fatty Acids (FISH OIL) 1000 MG CAPS Take by mouth daily.    . bisacodyl (DULCOLAX) 5 MG EC tablet Take 5 mg by mouth. Dulcolax 5 mg bowel prep #4-Take as directed    . Polyethylene Glycol 3350 (MIRALAX PO) Take by mouth. Miralax 119 gm bowel prep-Take as directed     No current facility-administered medications for this visit.     Allergies as of 02/20/2018 - Review Complete 02/20/2018  Allergen Reaction Noted  . Ibuprofen   06/14/2016  . Pravastatin Other (See Comments) 07/11/2017    Vitals: BP 113/80   Pulse  80   Ht 5\' 3"  (1.6 m)   Wt 209 lb (94.8 kg)   BMI 37.02 kg/m  Last Weight:  Wt Readings from Last 1 Encounters:  02/20/18 209 lb (94.8 kg)   GXQ:JJHE mass index is 37.02 kg/m.     Last Height:   Ht Readings from Last 1 Encounters:  02/20/18 5\' 3"  (1.6 m)    Physical exam:  General: The patient is awake, alert and appears not in acute distress. The patient is well groomed. Head: Normocephalic, atraumatic. Neck is supple. Mallampati 4,  neck circumference:15.5 . Nasal airflow mild congestion ,  Retrognathia is seen.  Cardiovascular:  Regular rate and rhythm, without  murmurs or carotid bruit, and without distended neck veins. Respiratory: Lungs are clear to auscultation. Skin:  ankle edema, no rash Trunk: BMI is 37.02. The patient's posture is slightly stooped.  Neurologic exam : The patient is awake and alert, oriented to place and time.    Attention span & concentration ability appears normal.  Speech is fluent,  without  dysarthria, but with dysphonia - a nasal voice. Mood and affect are appropriate.  Cranial nerves: Pupils are equal and briskly reactive to light. Funduscopic exam, right eye cataract removed, left is not matured,  without evidence of pallor or edema. Extraocular movements  in vertical and horizontal planes intact and without nystagmus. Visual fields by finger perimetry are intact. Hearing to finger rub intact. Facial sensation intact to fine touch.Facial motor strength is symmetric and tongue and uvula move midline. Shoulder shrug was symmetrical.  Motor exam:   Normal tone, muscle bulk and symmetric strength in all extremities. Sensory:  Fingertips are numb - subjective report, confirmed by decreased pin prick. Fine touch, pinprick and vibration were tested in all extremities. Proprioception tested in the upper extremities was normal. Coordination:  Finger-to-nose maneuver  normal without evidence of ataxia, dysmetria or tremor. No handwriting changes. Gait and station: Patient walks without assistive device .Deep tendon reflexes: in the  upper and lower extremities are symmetric and intact. Babinski maneuver response is downgoing.  Assessment:  After physical and neurologic examination, review of laboratory studies,  Personal review of imaging studies, reports of other /same  Imaging studies, results of polysomnography and / or neurophysiology testing and pre-existing records as far as provided in visit., my assessment is   1) Mrs. Di Jasmer carries a diagnosis of obstructive sleep apnea, but I do not have access to her original sleep study.  She may have had very mild or very severe sleep apnea, however it was enough to start CPAP therapy.  She moved to [place] about 2 years ago and by that time had already discontinued CPAP use for 2 years or longer.  The patient has no history of a prior myocardial infarct, prior stroke, has never been diagnosed with CHF or peripheral vascular disease.  She insists she is a non-smoker and has been all her life.  She feels no impaired by sleepiness, fatigue and denies any symptoms such as morning headaches, GERD, but endorsed a dry mouth.   The patient was advised of the nature of the diagnosed disorder , the treatment options and the  risks for general health and wellness arising from not treating the condition.   I spent more than 40 minutes of face to face time with the patient.  Greater than 50% of time was spent in counseling and coordination of care. We have discussed the diagnosis and differential and I answered the patient's questions.  Plan:  Treatment plan and additional workup :  I think Mrs. Yankovich has enough risk factors for the presence of obstructive sleep apnea and her ankle edema could also be a symptom.  She has not been treated for hypertension, but she does have diabetes, there has been some weight gain.   Certainly since her last sleep study was completed.  I would like to invite her for a split-night polysomnography without capnography, the patient stated that the nasal pillows began to irritate her nostrils at night but she has never used a nasal cradle or a nasal mask.  I will leave it up to our technologist here to refit her accordingly.   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    Larey Seat, MD 4/76/5465, 0:35 PM  Certified in Neurology by ABPN Certified in [place] by [facility] [address] [place], [place] [address]

## 2018-03-08 ENCOUNTER — Telehealth: Payer: Self-pay | Admitting: Gastroenterology

## 2018-03-08 NOTE — Telephone Encounter (Signed)
Hi Dr. Loletha Carrow, this pt just cancelled her colonoscopy scheduled tomorrow 03/09/18 with you. She stated that she had to go out of town to see her sister who passed away. She returned home today and had forgotten about procedure. She re-schedule to 04/20/18. Thank you.

## 2018-03-08 NOTE — Telephone Encounter (Signed)
Understood - thanks for letting me know.

## 2018-03-09 ENCOUNTER — Encounter: Payer: Medicare HMO | Admitting: Gastroenterology

## 2018-04-03 IMAGING — MR MR HEAD W/O CM
9 of 10 series · 38 of 48 positions shown · non-contrast
Comparison: 10/29/2016 CT head

CLINICAL DATA: Dizziness. This began earlier today but has now
improved. Stroke risk factors include diabetes.

EXAM:
MRI HEAD WITHOUT CONTRAST
TECHNIQUE: Multiplanar, multiecho pulse sequences of the brain and surrounding
structures were obtained without intravenous contrast.

[Series 4: DWI · axial · 3.0mm · 1.09mm/px · z∈[-82,+69]mm · 11 of 104 slices shown (1 of 4)]
[im 1/104]
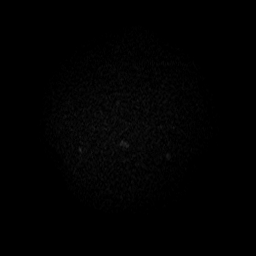
[im 11/104]
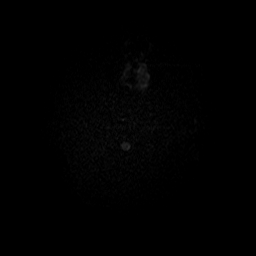
[im 21/104]
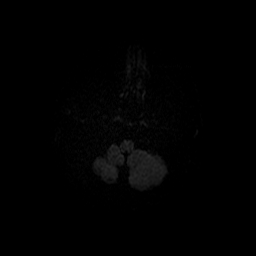
[im 31/104]
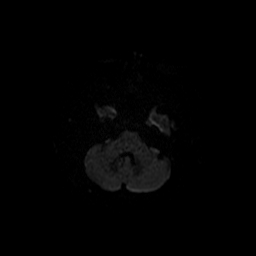
[im 42/104]
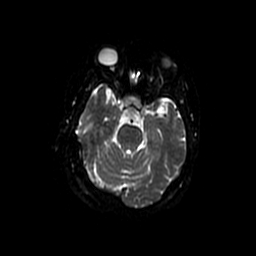
[im 52/104]
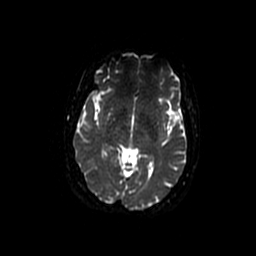
[im 62/104]
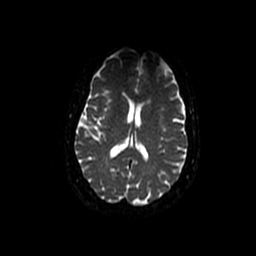
[im 73/104]
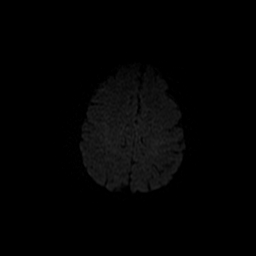
[im 83/104]
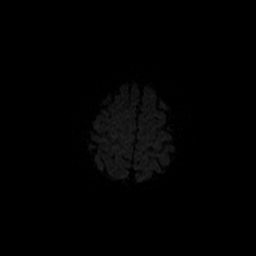
[im 93/104]
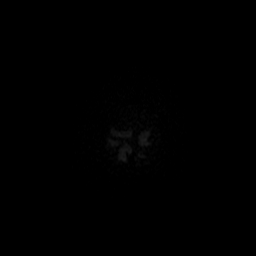
[im 104/104]
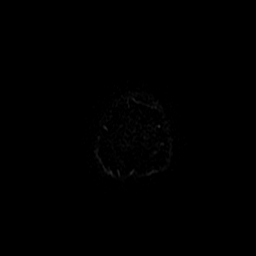

[Series 5: DWI · coronal · 5.0mm · 1.09mm/px · 7 of 78 slices shown (2 of 4)]
[im 1/78]
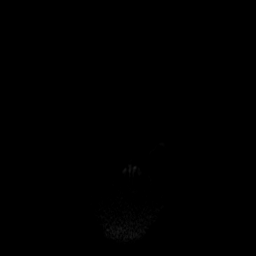
[im 13/78]
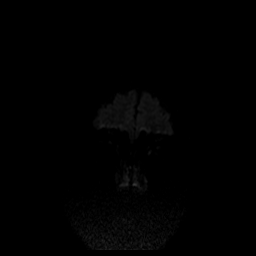
[im 26/78]
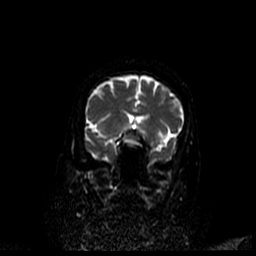
[im 39/78]
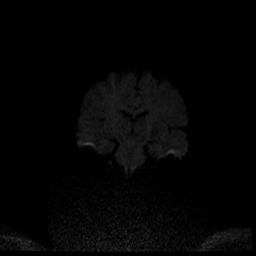
[im 52/78]
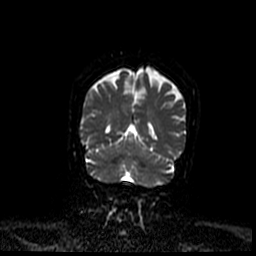
[im 65/78]
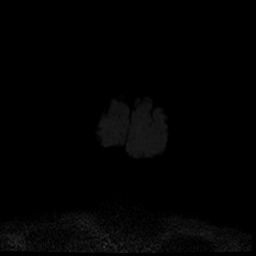
[im 78/78]
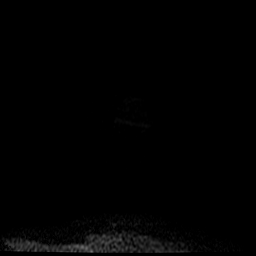

[Series 6: T1 · sagittal · 5.0mm · 0.47mm/px · 2 of 25 slices shown]
[im 1/25]
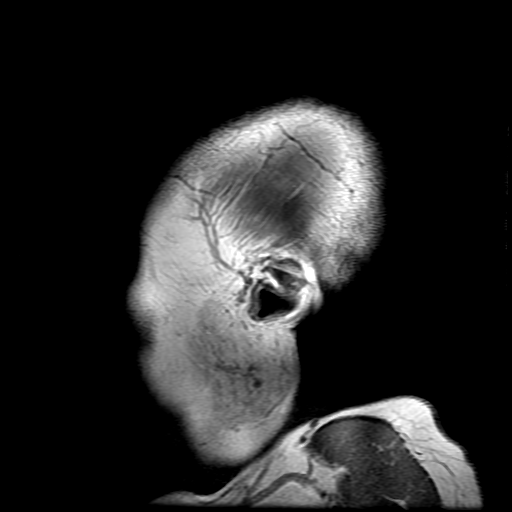
[im 25/25]
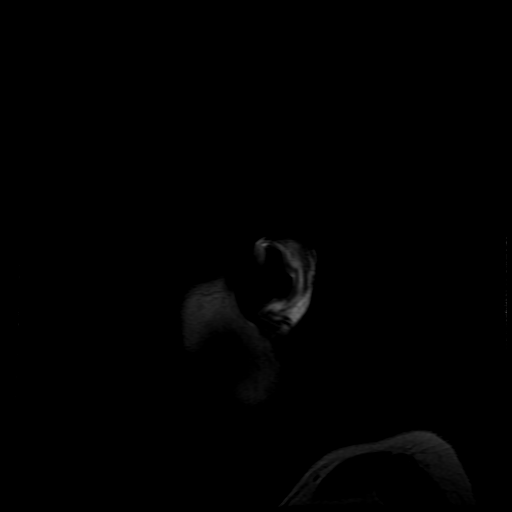

[Series 7: T2 · axial · 5.0mm · 0.43mm/px · z∈[-78,+71]mm · 2 of 26 slices shown]
[im 1/26]
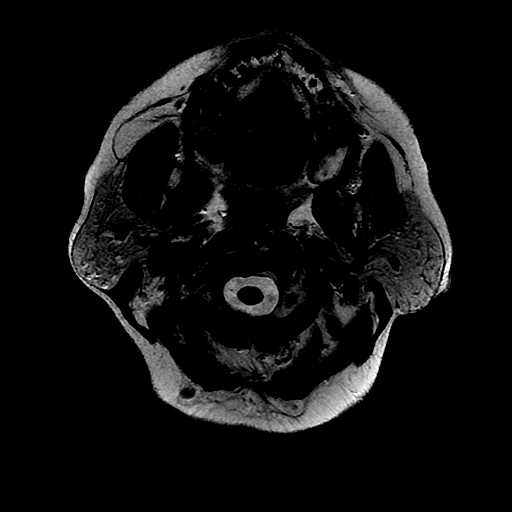
[im 26/26]
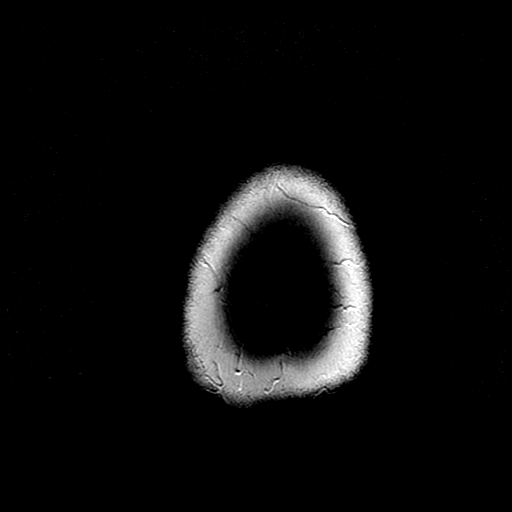

[Series 8: FLAIR · axial · 5.0mm · 0.43mm/px · z∈[-78,+71]mm · 2 of 26 slices shown]
[im 1/26]
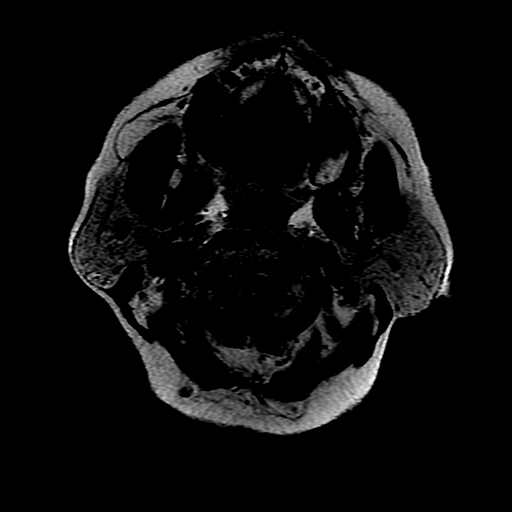
[im 26/26]
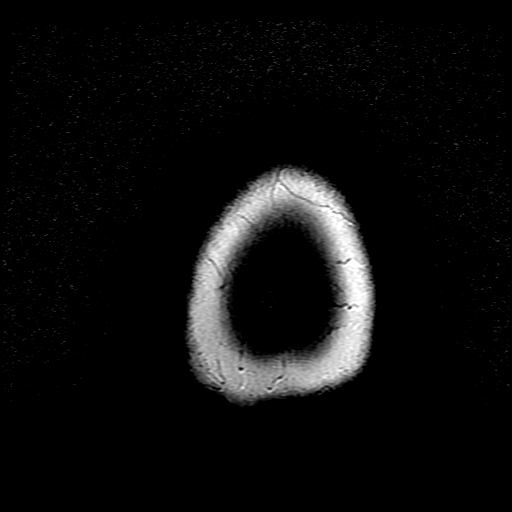

[Series 9: ax mpgr · axial · 5.0mm · 0.43mm/px · z∈[-78,+71]mm · 2 of 26 slices shown]
[im 1/26]
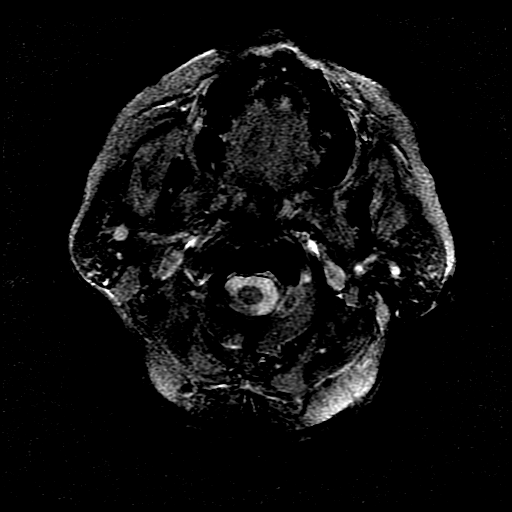
[im 26/26]
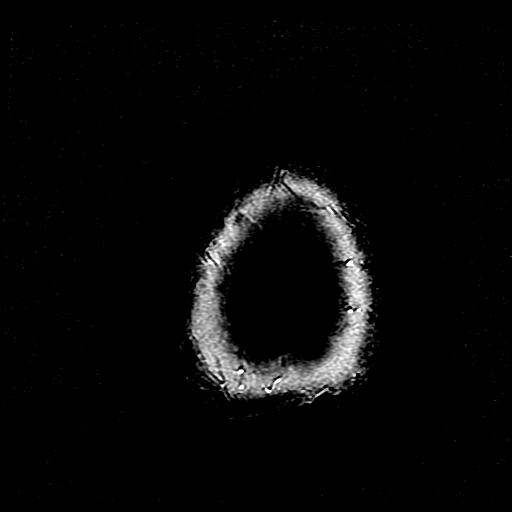

[Series 11: T2 post-contrast · coronal · 5.0mm · 0.39mm/px · 3 of 30 slices shown]
[im 1/30]
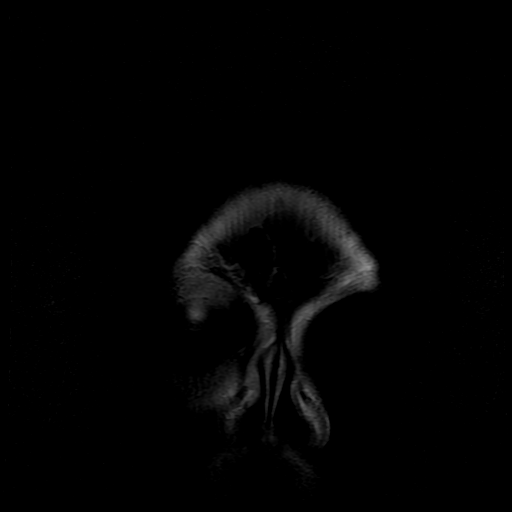
[im 15/30]
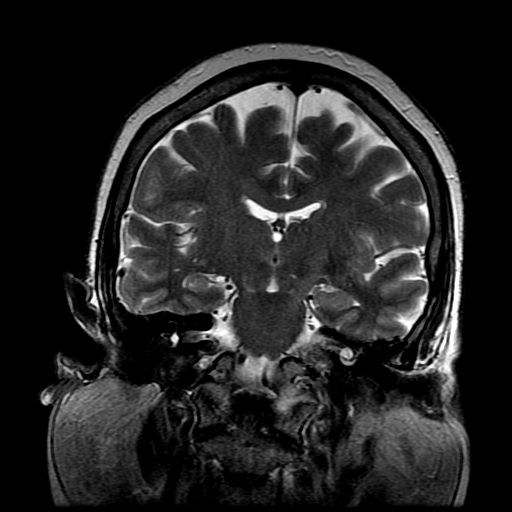
[im 30/30]
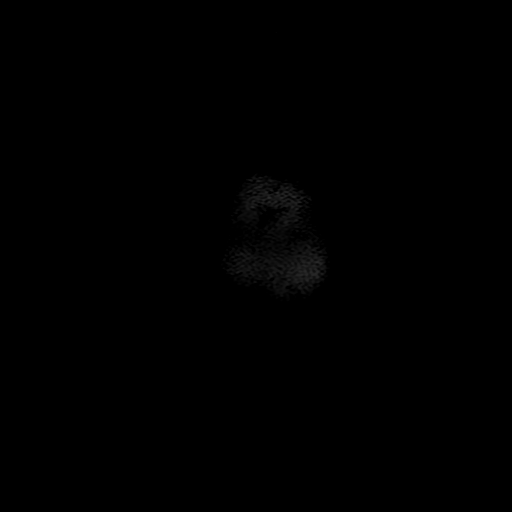

[Series 400: DWI · axial · 3.0mm · 1.09mm/px · z∈[-82,+69]mm · 5 of 52 slices shown (3 of 4)]
[im 1/52]
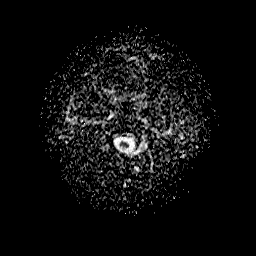
[im 13/52]
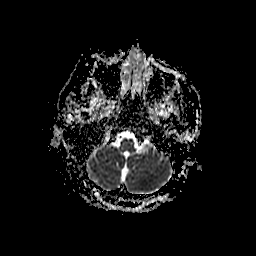
[im 26/52]
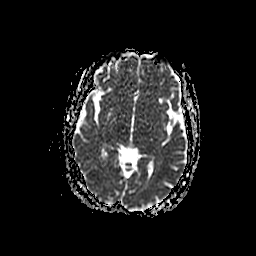
[im 39/52]
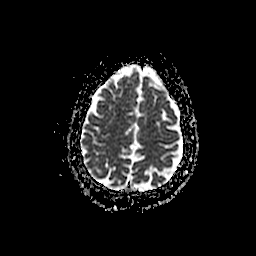
[im 52/52]
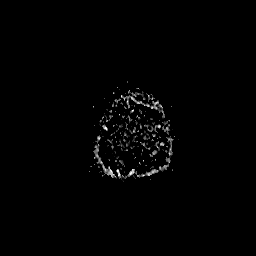

[Series 500: DWI · coronal · 5.0mm · 1.09mm/px · 4 of 39 slices shown (4 of 4)]
[im 1/39]
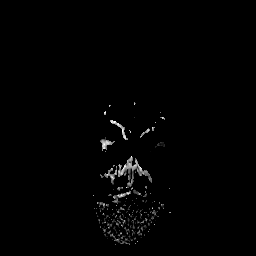
[im 13/39]
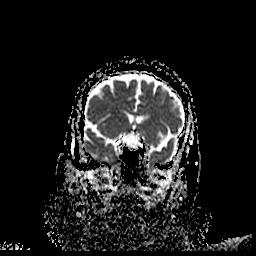
[im 26/39]
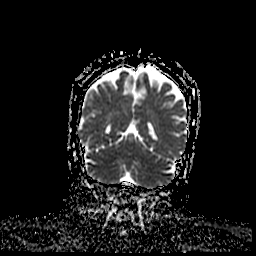
[im 39/39]
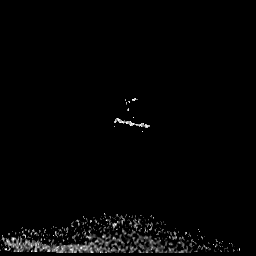

[38 of 48 positions shown; findings below may reference images not displayed]

FINDINGS: Brain: No evidence for acute infarction, hemorrhage, mass lesion,
hydrocephalus, or extra-axial fluid. Mild cerebral and cerebellar
atrophy. Mild to moderate subcortical and periventricular T2 and
FLAIR hyperintensities, likely chronic microvascular ischemic
change.

Vascular: Normal flow voids.  RIGHT vertebral dominant, both patent.

Skull and upper cervical spine: Normal marrow signal.

Sinuses/Orbits: Negative.

Other: None.
IMPRESSION: Mild atrophy.  Mild to moderate small vessel disease.

No acute intracranial findings. No evidence for proximal vascular
occlusion.

## 2018-04-09 ENCOUNTER — Telehealth: Payer: Self-pay

## 2018-04-09 ENCOUNTER — Ambulatory Visit (AMBULATORY_SURGERY_CENTER): Payer: Self-pay | Admitting: *Deleted

## 2018-04-09 VITALS — Ht 63.0 in | Wt 206.0 lb

## 2018-04-09 DIAGNOSIS — Z1211 Encounter for screening for malignant neoplasm of colon: Secondary | ICD-10-CM

## 2018-04-09 NOTE — Progress Notes (Signed)
Patient denies any allergies to eggs or soy. Patient denies any problems with anesthesia/sedation. Patient denies any oxygen use at home. Patient denies taking any diet/weight loss medications or blood thinners. Pt states she has been having diarrhea for the past month. Patient denies any pain or fever. States It comes and goes, she did go to Urgent care and was given antibiotics and she did tell her PCP about this. Encouraged patient to call us back if this continues.

## 2018-04-09 NOTE — Telephone Encounter (Signed)
Pt has had 3 appts for her sleep study. Her first appt was on 10/4, which pt cancelled the day before during confirmation phone call. Her second appt was on 10/13, which she was confirmed but showed up stating she had stomach virus. This caused the sleep lab not to fill this appt.  Her third appt was for 10/31, which she No showed.  Pt was called twice and left messages about this appt. Night shift tech even called her at 21:27 and pt stated she was in the bed with a virus and forgot about her appt. This caused the sleep lab to not be able to fill bed again for second time.   Dr. Brett Fairy was informed of all this and stated we are not to reschedule patient at this time. Dr. Brett Fairy states that she is to be dismissed.

## 2018-04-10 ENCOUNTER — Encounter: Payer: Self-pay | Admitting: Gastroenterology

## 2018-04-10 ENCOUNTER — Encounter: Payer: Self-pay | Admitting: Neurology

## 2018-04-20 ENCOUNTER — Encounter: Payer: Self-pay | Admitting: Gastroenterology

## 2018-04-20 ENCOUNTER — Ambulatory Visit: Payer: Medicare HMO | Admitting: Gastroenterology

## 2018-04-20 VITALS — BP 148/94 | HR 66 | Temp 97.3°F | Ht 63.0 in | Wt 206.0 lb

## 2018-04-20 MED ORDER — SODIUM CHLORIDE 0.9 % IV SOLN: 500.0000 mL | Freq: Once | INTRAVENOUS | Status: DC

## 2018-04-20 NOTE — Progress Notes (Signed)
Patient with multiple unsuccessful IV sticks.  Dr. Loletha Carrow notified by Rudell Cobb, CRNA. Daughter brought back and at 13:40 Dr. Loletha Carrow brought in to speak with them. Decision made to reschedule at the hospital.Both patient and daughter agreed. Patient/daughter aware that Dr. Loletha Carrow' nurse to call and reschedule. Patient left at 13:45.

## 2018-04-24 ENCOUNTER — Telehealth: Payer: Self-pay | Admitting: Gastroenterology

## 2018-04-24 NOTE — Telephone Encounter (Signed)
This patient was unable to have her colonoscopy at the [facility] last week because IV access could not be obtained.  Please reschedule this patient to my next available Abigail Houston outpatient block in January (or February if January is full).

## 2018-04-24 NOTE — Telephone Encounter (Signed)
Recall placed as hospital schedule is not available for Jan or Feb.

## 2018-07-25 ENCOUNTER — Ambulatory Visit (HOSPITAL_COMMUNITY)
Admission: EM | Admit: 2018-07-25 | Discharge: 2018-07-25 | Disposition: A | Discharge status: Home / Self Care | Payer: Medicare Other | Attending: Family Medicine | Admitting: Family Medicine

## 2018-07-25 ENCOUNTER — Encounter (HOSPITAL_COMMUNITY): Payer: Self-pay | Admitting: Emergency Medicine

## 2018-07-25 DIAGNOSIS — M7062 Trochanteric bursitis, left hip: Secondary | ICD-10-CM

## 2018-07-25 MED ORDER — PREDNISONE 20 MG PO TABS: ORAL_TABLET | ORAL | 0 refills | Status: DC

## 2018-07-25 NOTE — ED Provider Notes (Signed)
[facility]    CSN: [id number] Arrival date & time: 07/25/18  1536     History   Chief Complaint Chief Complaint  Patient presents with  . Hip Pain    HPI Abigail Houston is a 68 y.o. female.    This is an initial Abigail Houston urgent care visit for this 68 year old woman.  Pt presents to [facility] for assessment of left lower back pain, buttocks pain and radiation down the back of her left leg.  C/o pain starting yesterday, resolved with tylenol yesterday, today it's not helping.   Patient is originally from [place].  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  She is done no recent unusual activities and has not fallen.       Past Medical History:  Diagnosis Date  . Arthritis   . Cataract   . Diabetes mellitus without complication ([place])   . Sleep apnea   . Snoring     Patient Active Problem List   Diagnosis Date Noted  . Ankle edema, bilateral 02/20/2018  . Nocturia more than twice per night 02/20/2018  . Obesity (BMI 35.0-39.9 without comorbidity) 02/20/2018  . Type 2 diabetes mellitus with diabetic mononeuropathy, without long-term current use of insulin ([place]) 02/20/2018  . Snoring 02/20/2018  . Varicose veins of bilateral lower extremities with other complications 23/53/6144  . Slow transit constipation 03/24/2016    Past Surgical History:  Procedure Laterality Date  . arm fracture  1984   left  . CESAREAN SECTION     one time  . EYE SURGERY  07/2017   right eye cataract surgery  . varicose veins  11/2017   left leg    OB History    Gravida  6   Para  5   Term  5   Preterm      AB  1   Living  2     SAB      TAB  1   Ectopic      Multiple      Live Births  3            Home Medications    Prior to Admission medications   Medication Sig Start Date End Date Taking? Authorizing Provider  aspirin EC 81 MG tablet Take 81 mg by mouth daily.   Yes [provider]  CALCIUM PO  Take by mouth.   Yes [provider]  cholecalciferol (VITAMIN D) 1000 units tablet Take 1,000 Units by mouth daily.   Yes [provider]  furosemide (LASIX) 20 MG tablet Take by mouth. 10/19/17  Yes [provider]  metFORMIN (GLUCOPHAGE) 500 MG tablet Take by mouth. Take 2 pills in am and one pill at night   Yes [provider]  VITAMIN E PO Take by mouth.   Yes [provider]  Multiple Vitamins-Minerals (CENTRUM SILVER PO) Take by mouth daily.    [provider]  Omega-3 Fatty Acids (FISH OIL) 1000 MG CAPS Take by mouth daily.    [provider]  predniSONE (DELTASONE) 20 MG tablet Two daily with food 07/25/18   Robyn Haber, MD    Family History Family History  Problem Relation Age of Onset  . Ovarian cancer Mother   . Diabetes Sister   . Heart disease Brother   . Colon cancer Neg Hx   . Esophageal cancer Neg Hx   . Stomach cancer Neg Hx   .  Rectal cancer Neg Hx   . Colon polyps Neg Hx     Social History Social History   Tobacco Use  . Smoking status: Never Smoker  . Smokeless tobacco: Never Used  Substance Use Topics  . Alcohol use: Yes    Alcohol/week: 1.0 standard drinks    Types: 1 Cans of beer per week    Comment: occasional  . Drug use: No     Allergies   Motrin [ibuprofen]; Pravastatin; Ampicillin; and Penicillins   Review of Systems Review of Systems   Physical Exam Triage Vital Signs ED Triage Vitals [07/25/18 1555]  Enc Vitals Group     BP      Pulse      Resp      Temp 98.6 F (37 C)     Temp Source Oral     SpO2      Weight      Height      Head Circumference      Peak Flow      Pain Score 9     Pain Loc      Pain Edu?      Excl. in [place]?    No data found.  Updated Vital Signs Temp 98.6 F (37 C) (Oral)    Physical Exam Vitals signs and nursing note reviewed.  Constitutional:      Appearance: Normal appearance. She is obese.  HENT:     Head: Normocephalic.      Mouth/Throat:     Pharynx: Oropharynx is clear.  Pulmonary:     Effort: Pulmonary effort is normal.  Musculoskeletal:     Comments: Straight leg raising is negative to both sides.  Palpating the left trochanter, patient has exquisite tenderness  Skin:    General: Skin is warm and dry.  Neurological:     General: No focal deficit present.     Mental Status: She is alert and oriented to person, place, and time.  Psychiatric:        Mood and Affect: Mood normal.      UC Treatments / Results  Labs (all labs ordered are listed, but only abnormal results are displayed) Labs Reviewed - No data to display  EKG None  Radiology No results found.  Procedures Procedures (including critical care time)  Medications Ordered in UC Medications - No data to display  Initial Impression / Assessment and Plan / UC Course  I have reviewed the triage vital signs and the nursing notes.  Pertinent labs & imaging results that were available during my care of the patient were reviewed by me and considered in my medical decision making (see chart for details).    Final Clinical Impressions(s) / UC Diagnoses   Final diagnoses:  Trochanteric bursitis, left hip   Discharge Instructions   None    ED Prescriptions    Medication Sig Dispense Auth. Provider   predniSONE (DELTASONE) 20 MG tablet Two daily with food 10 tablet Robyn Haber, MD     Controlled Substance Prescriptions [place] Controlled Substance Registry consulted? No   Robyn Haber, MD 07/25/18 1645

## 2018-07-25 NOTE — ED Triage Notes (Signed)
Pt presents to [facility] for assessment of left lower back pain, buttocks pain and radiation down the back of her left leg.  C/o pain starting yesterday, resolved with tylenol yesterday, today it's not helping.

## 2018-09-20 ENCOUNTER — Other Ambulatory Visit: Payer: Self-pay | Admitting: Family Medicine

## 2018-09-20 DIAGNOSIS — Z1231 Encounter for screening mammogram for malignant neoplasm of breast: Secondary | ICD-10-CM

## 2018-11-15 ENCOUNTER — Ambulatory Visit: Payer: Medicare Other

## 2018-11-17 ENCOUNTER — Ambulatory Visit
Admission: RE | Admit: 2018-11-17 | Discharge: 2018-11-17 | Disposition: A | Discharge status: Home / Self Care | Payer: Medicare Other | Source: Ambulatory Visit | Attending: Family Medicine | Admitting: Family Medicine

## 2018-11-17 ENCOUNTER — Other Ambulatory Visit: Payer: Self-pay

## 2018-11-17 DIAGNOSIS — Z1231 Encounter for screening mammogram for malignant neoplasm of breast: Secondary | ICD-10-CM

## 2018-11-20 ENCOUNTER — Other Ambulatory Visit: Payer: Self-pay | Admitting: Family Medicine

## 2018-11-20 DIAGNOSIS — R928 Other abnormal and inconclusive findings on diagnostic imaging of breast: Secondary | ICD-10-CM

## 2018-11-28 ENCOUNTER — Ambulatory Visit
Admission: RE | Admit: 2018-11-28 | Discharge: 2018-11-28 | Disposition: A | Discharge status: Home / Self Care | Payer: Medicare Other | Source: Ambulatory Visit | Attending: Family Medicine | Admitting: Family Medicine

## 2018-11-28 ENCOUNTER — Other Ambulatory Visit: Payer: Self-pay

## 2018-11-28 DIAGNOSIS — R928 Other abnormal and inconclusive findings on diagnostic imaging of breast: Secondary | ICD-10-CM

## 2018-12-03 ENCOUNTER — Encounter (HOSPITAL_COMMUNITY): Payer: Self-pay | Admitting: Emergency Medicine

## 2018-12-03 ENCOUNTER — Emergency Department (HOSPITAL_COMMUNITY): Payer: Medicare Other

## 2018-12-03 ENCOUNTER — Other Ambulatory Visit: Payer: Self-pay

## 2018-12-03 ENCOUNTER — Emergency Department (HOSPITAL_COMMUNITY)
Admission: EM | Admit: 2018-12-03 | Discharge: 2018-12-03 | Disposition: A | Discharge status: Home / Self Care | Payer: Medicare Other | Attending: Emergency Medicine | Admitting: Emergency Medicine

## 2018-12-03 DIAGNOSIS — Z79899 Other long term (current) drug therapy: Secondary | ICD-10-CM | POA: Insufficient documentation

## 2018-12-03 DIAGNOSIS — Z7982 Long term (current) use of aspirin: Secondary | ICD-10-CM | POA: Insufficient documentation

## 2018-12-03 DIAGNOSIS — M25552 Pain in left hip: Secondary | ICD-10-CM

## 2018-12-03 DIAGNOSIS — E119 Type 2 diabetes mellitus without complications: Secondary | ICD-10-CM | POA: Diagnosis not present

## 2018-12-03 MED ORDER — OXYCODONE-ACETAMINOPHEN 5-325 MG PO TABS: 1.0000 | ORAL_TABLET | Freq: Four times a day (QID) | ORAL | 0 refills | Status: DC | PRN

## 2018-12-03 MED ORDER — LIDOCAINE 5 % EX PTCH: 1.0000 | MEDICATED_PATCH | CUTANEOUS | 0 refills | Status: DC

## 2018-12-03 MED ORDER — OXYCODONE-ACETAMINOPHEN 5-325 MG PO TABS
1.0000 | ORAL_TABLET | Freq: Once | ORAL | Status: DC
  Filled 2018-12-03: qty 1

## 2018-12-03 NOTE — ED Notes (Signed)
Patient verbalizes understanding of discharge instructions. Opportunity for questioning and answers were provided. Armband removed by staff, pt discharged from ED.  

## 2018-12-03 NOTE — Discharge Instructions (Signed)
You are seen in the emergency department today for left hip pain.  Your x-ray showed some degenerative changes but no new abnormalities.  We suspect your pain is due to muscle, bone, or bursa irritation.  We are sending you home with a prescription for Percocet and Lidoderm patches to take as needed for pain. -Percocet-this is a narcotic/controlled substance medication that has potential addicting qualities.  We recommend that you take 1-2 tablets every 6 hours as needed for severe pain.  Do not drive or operate heavy machinery when taking this medicine as it can be sedating. Do not drink alcohol or take other sedating medications when taking this medicine for safety reasons.  Keep this out of reach of small children.  Please be aware this medicine has Tylenol in it (325 mg/tab) do not exceed the maximum dose of Tylenol in a day per over the counter recommendations should you decide to supplement with Tylenol over the counter.  -

## 2018-12-03 NOTE — ED Triage Notes (Signed)
Pt arrives to ED from home with complaints of left leg pain that starts at the buttocks and radiates down to the foot for the last two days.

## 2018-12-03 NOTE — ED Provider Notes (Signed)
[facility] EMERGENCY DEPARTMENT Provider Note   CSN: [id number] Arrival date & time: 12/03/18  0751    History   Chief Complaint Chief Complaint  Patient presents with  . Leg Pain    HPI Abigail Houston is a 68 y.o. female w/ a hx of DM, sleep apnea, obesity, & BLE varicose veins who presents to the ED with complaints of L hip pain that began 2 days prior. Patient states pain is located in the lateral aspect of the left hip, it is sharp & constant, now intermittently radiating down the lateral aspect of the LLE. Pain is currently a 10/10 in severity, worse with movement & with laying on the L side. No alleviating factors. Reports she was having some pain to varicose veins to the knee area of the LLE a week ago but this has mostly resolved. Denies injury or significant change in activity. She has been standing more with a new job for about 4 hours at a time. Denies numbness, tingling, weakness, saddle anesthesia, incontinence to bowel/bladder, fever, chills, IV drug use, dysuria, or hx of cancer. Patient has not had prior back/LLE surgeries. No LLE swelling. No rashes noted.      HPI  Past Medical History:  Diagnosis Date  . Arthritis   . Cataract   . Diabetes mellitus without complication ([place])   . Sleep apnea   . Snoring     Patient Active Problem List   Diagnosis Date Noted  . Ankle edema, bilateral 02/20/2018  . Nocturia more than twice per night 02/20/2018  . Obesity (BMI 35.0-39.9 without comorbidity) 02/20/2018  . Type 2 diabetes mellitus with diabetic mononeuropathy, without long-term current use of insulin ([place]) 02/20/2018  . Snoring 02/20/2018  . Varicose veins of bilateral lower extremities with other complications 35/57/3220  . Slow transit constipation 03/24/2016    Past Surgical History:  Procedure Laterality Date  . arm fracture  1984   left  . CESAREAN SECTION     one time  . EYE SURGERY  07/2017   right eye cataract surgery  . varicose  veins  11/2017   left leg     OB History    Gravida  6   Para  5   Term  5   Preterm      AB  1   Living  2     SAB      TAB  1   Ectopic      Multiple      Live Births  3            Home Medications    Prior to Admission medications   Medication Sig Start Date End Date Taking? Authorizing Provider  aspirin EC 81 MG tablet Take 81 mg by mouth daily.    [provider]  CALCIUM PO Take by mouth.    [provider]  cholecalciferol (VITAMIN D) 1000 units tablet Take 1,000 Units by mouth daily.    [provider]  furosemide (LASIX) 20 MG tablet Take by mouth. 10/19/17   [provider]  metFORMIN (GLUCOPHAGE) 500 MG tablet Take by mouth. Take 2 pills in am and one pill at night    [provider]  Multiple Vitamins-Minerals (CENTRUM SILVER PO) Take by mouth daily.    [provider]  Omega-3 Fatty Acids (FISH OIL) 1000 MG CAPS Take by mouth daily.    [provider]  predniSONE (DELTASONE) 20 MG tablet Two daily with  food 07/25/18   Robyn Haber, MD  VITAMIN E PO Take by mouth.    [provider]    Family History Family History  Problem Relation Age of Onset  . Ovarian cancer Mother   . Diabetes Sister   . Heart disease Brother   . Colon cancer Neg Hx   . Esophageal cancer Neg Hx   . Stomach cancer Neg Hx   . Rectal cancer Neg Hx   . Colon polyps Neg Hx     Social History Social History   Tobacco Use  . Smoking status: Never Smoker  . Smokeless tobacco: Never Used  Substance Use Topics  . Alcohol use: Yes    Alcohol/week: 1.0 standard drinks    Types: 1 Cans of beer per week    Comment: occasional  . Drug use: No     Allergies   Motrin [ibuprofen], Pravastatin, Ampicillin, and Penicillins   Review of Systems Review of Systems  Constitutional: Negative for chills and fever.  Respiratory: Negative for shortness of breath.   Cardiovascular: Negative for chest pain  and leg swelling.  Genitourinary: Negative for dysuria.  Musculoskeletal: Positive for arthralgias and myalgias.  Skin: Negative for wound.  Neurological: Negative for weakness and numbness.       Negative for incontinence/saddle anesthesia.   All other systems reviewed and are negative.    Physical Exam Updated Vital Signs BP 135/86 (BP Location: Right Arm)   Pulse 85   Temp 98.2 F (36.8 C) (Oral)   Resp 20   SpO2 99%   Physical Exam Vitals signs and nursing note reviewed.  Constitutional:      Appearance: She is well-developed. She is not ill-appearing or toxic-appearing.     Comments: Appears somewhat uncomfortable.  HENT:     Head: Normocephalic and atraumatic.  Eyes:     General:        Right eye: No discharge.        Left eye: No discharge.     Conjunctiva/sclera: Conjunctivae normal.  Neck:     Musculoskeletal: Neck supple.  Cardiovascular:     Rate and Rhythm: Normal rate and regular rhythm.     Pulses:          Dorsalis pedis pulses are 2+ on the right side and 2+ on the left side.       Posterior tibial pulses are 2+ on the right side and 2+ on the left side.  Pulmonary:     Effort: Pulmonary effort is normal. No respiratory distress.     Breath sounds: Normal breath sounds. No wheezing, rhonchi or rales.  Abdominal:     General: There is no distension.     Palpations: Abdomen is soft.     Tenderness: There is no abdominal tenderness. There is no guarding or rebound.  Musculoskeletal:     Comments: Back: No rashes/skin changes.  Tender to diffuse lower L spine extending to lower left paraspinal muscles & L gluteal region.  Lower extremities: Varicose veins noted bilaterally. No rashes/skin changes. No obvious deformity, appreciable swelling, edema, erythema, ecchymosis, warmth, or open wounds. Patient has intact AROM to bilateral hips, knees, ankles, and all digits with the exception of some limitation with L hip flexion- able to do so for the most  part.Tender to palpation to the lateral hip, most prominently to the greater trochanter. Some tenderness to lateral aspect of the proximal femur. Otherwise without significant tenderness. No calf tenderness.    Skin:  General: Skin is warm and dry.     Capillary Refill: Capillary refill takes less than 2 seconds.     Findings: No rash.  Neurological:     Mental Status: She is alert.     Comments: Alert. Clear speech. Sensation grossly intact to bilateral lower extremities. 5/5 strength with knee flexion/extension & ankle plantar/dorsiflexion bilaterally. 2+ symmetric patellar DTRs. Patient ambulatory with antalgic gait, no foot drop noted.   Psychiatric:        Mood and Affect: Mood normal.        Behavior: Behavior normal.    ED Treatments / Results  Labs (all labs ordered are listed, but only abnormal results are displayed) Labs Reviewed - No data to display  EKG    Radiology Dg Lumbar Spine Complete  Result Date: 12/03/2018 CLINICAL DATA:  Pain with left-sided radicular symptoms EXAM: LUMBAR SPINE - COMPLETE 4+ VIEW COMPARISON:  None. FINDINGS: Frontal, lateral, spot lumbosacral lateral, and bilateral oblique views were obtained. There are 5 non-rib-bearing lumbar type vertebral bodies. There is no fracture or spondylolisthesis. There is moderate disc space narrowing at L4-5 and L5-S1 with slight disc space narrowing at L3-4. There is facet osteoarthritic change at L3-4, L4-5, and L5-S1 bilaterally. IMPRESSION: Osteoarthritic change at several levels. No fracture or spondylolisthesis. Electronically Signed   By: Lowella Grip III M.D.   On: 12/03/2018 09:41   Dg Hip Unilat With Pelvis 2-3 Views Left  Result Date: 12/03/2018 CLINICAL DATA:  Pain EXAM: DG HIP (WITH OR WITHOUT PELVIS) 2-3V LEFT COMPARISON:  None. FINDINGS: Frontal pelvis as well as frontal and lateral left hip images were obtained. No fracture or dislocation. There is moderate symmetric narrowing of each hip joint.  No erosive changes. Sacroiliac joints appear normal. IMPRESSION: Moderate symmetric narrowing of each hip joint. No fracture or dislocation. Electronically Signed   By: Lowella Grip III M.D.   On: 12/03/2018 09:40    Procedures Procedures (including critical care time)  Medications Ordered in ED Medications - No data to display   Initial Impression / Assessment and Plan / ED Course  I have reviewed the triage vital signs and the nursing notes.  Pertinent labs & imaging results that were available during my care of the patient were reviewed by me and considered in my medical decision making (see chart for details).   Patient presents to the emergency department with complaints of left lateral hip pain x 2 days.  Patient is nontoxic-appearing, does appear mildly uncomfortable, vitals without significant abnormality.  Given her age and exam x-rays were obtained which revealed findings consistent with degenerative changes but no acute abnormality.  No obvious mass or fracture/dislocation.  Patient without fever, erythema, warmth, or IVDU, does not seem consistent with infectious process such as septic joint, osteomyelitis, spinal abscess, or cellulitis.  No rash to indicate shingles.  No edema, no calf tenderness, does not seem consistent with DVT.  No focal neurologic deficits, doubt cauda equina syndrome.  No focal deficits, symmetric pulses, doubt dissection.  Overall seems musculoskeletal in nature.  Suspect a degree of trochanteric bursitis, there is possibly a sciatica component with muscular issue.  Patient is allergic to NSAIDs.  Will treat with short course of Percocet, [place] Controlled Substance reporting System queried, and Lidoderm patches with PCP follow up. I discussed results, treatment plan, need for follow-up, and return precautions with the patient. Provided opportunity for questions, patient confirmed understanding and is in agreement with plan.   Findings and plan of  care discussed with supervising physician Dr. Laverta Baltimore who has evaluated patient throughout encounter & is in agreement.    Final Clinical Impressions(s) / ED Diagnoses   Final diagnoses:  Left hip pain    ED Discharge Orders         Ordered    oxyCODONE-acetaminophen (PERCOCET/ROXICET) 5-325 MG tablet  Every 6 hours PRN     12/03/18 1002    lidocaine (LIDODERM) 5 %  Every 24 hours     12/03/18 [address] [place], PA-C 12/03/18 1010    Margette Fast, MD 12/03/18 1912

## 2018-12-25 ENCOUNTER — Other Ambulatory Visit: Payer: Self-pay | Admitting: Family Medicine

## 2018-12-25 DIAGNOSIS — Z780 Asymptomatic menopausal state: Secondary | ICD-10-CM

## 2019-02-04 ENCOUNTER — Other Ambulatory Visit: Payer: Self-pay

## 2019-02-04 ENCOUNTER — Encounter: Payer: Self-pay | Admitting: Obstetrics and Gynecology

## 2019-02-04 ENCOUNTER — Ambulatory Visit (INDEPENDENT_AMBULATORY_CARE_PROVIDER_SITE_OTHER): Payer: Medicare Other | Admitting: Obstetrics and Gynecology

## 2019-02-04 VITALS — BP 112/76 | HR 93 | Ht 63.0 in | Wt 198.0 lb

## 2019-02-04 DIAGNOSIS — R9389 Abnormal findings on diagnostic imaging of other specified body structures: Secondary | ICD-10-CM | POA: Diagnosis not present

## 2019-02-04 MED ORDER — MISOPROSTOL 200 MCG PO TABS: ORAL_TABLET | ORAL | 1 refills | Status: DC

## 2019-02-04 NOTE — Progress Notes (Signed)
68 yo P3 postmenopausal for over 10 years with BMI 35 who is referred here for endometrial polyp evaluation. Patient reports feeling well. She denies a history of postmenopausal vaginal bleeding. She is not sexually active. She denies any urinary incontinence. She denies any pelvic pain or abnormal discharge  Past Medical History:  Diagnosis Date  . Arthritis   . Cataract   . Diabetes mellitus without complication ([place])   . Sleep apnea   . Snoring    Past Surgical History:  Procedure Laterality Date  . arm fracture  1984   left  . CESAREAN SECTION     one time  . EYE SURGERY  07/2017   right eye cataract surgery  . varicose veins  11/2017   left leg   Family History  Problem Relation Age of Onset  . Ovarian cancer Mother   . Diabetes Sister   . Heart disease Brother   . Colon cancer Neg Hx   . Esophageal cancer Neg Hx   . Stomach cancer Neg Hx   . Rectal cancer Neg Hx   . Colon polyps Neg Hx    Social History   Tobacco Use  . Smoking status: Never Smoker  . Smokeless tobacco: Never Used  Substance Use Topics  . Alcohol use: Yes    Alcohol/week: 1.0 standard drinks    Types: 1 Cans of beer per week    Comment: occasional  . Drug use: No   ROS See pertinent in HPI  Blood pressure 112/76, pulse 93, height 5\' 3"  (1.6 m), weight 198 lb (89.8 kg). GENERAL: Well-developed, well-nourished female in no acute distress.  ABDOMEN: Soft, nontender, nondistended. No organomegaly. PELVIC: Deferred to OR EXTREMITIES: No cyanosis, clubbing, or edema, 2+ distal pulses.  7/10 pelvic ultrasound  FINDINGS:  Uterus: The uterus is normal in size measuring 3.3 x 3.1 x 3.4 cm. The endometrial stripe is thick and heterogeneous, measuring 1.2 cm. Question 1.4 cm hyperechoic endometrial mass.   There is no free pelvic fluid. Unremarkable cervix.  Neither ovary is identified. Peristalsing bowel only is seen within the adnexal regions.   IMPRESSION:   The endometrium is thick and  heterogeneous. I suspect that there is also an endometrial polyp. Recommend tissue sampling versus sonohysterogram to better evaluate.  Electronically Signed by: Charlett Lango  A/P 68 yo postmenopausal with incidental finding of a thickened endometrium - Discussed surgical evaluation with D&C, hysteroscopy. Risks, benefits and alternatives were explained including but not limited to risks of bleeding, infection and damage to adjacent organs. Patient verbalized understanding and all questions were answered - Patient will be pre-medicated with cytotec the day prior to her surgery

## 2019-03-23 ENCOUNTER — Other Ambulatory Visit (HOSPITAL_COMMUNITY): Payer: Medicare Other

## 2019-05-14 ENCOUNTER — Telehealth: Payer: Self-pay

## 2019-05-14 NOTE — Telephone Encounter (Signed)
Pt reports that she is supposed to have a procedure done on 12/23. Pt reports that she was exposed to [place] last Wednesday. Pt had COVID test last Thursday and result is negative. The pt would like to know if she needs another COVID test now or if anything else needs to be done prior to her procedure on the 23rd.

## 2019-05-16 NOTE — Telephone Encounter (Signed)
Pt made aware of recommendations, she verbalizes understanding.

## 2019-05-23 ENCOUNTER — Encounter (HOSPITAL_BASED_OUTPATIENT_CLINIC_OR_DEPARTMENT_OTHER): Payer: Self-pay | Admitting: Obstetrics and Gynecology

## 2019-05-23 ENCOUNTER — Other Ambulatory Visit: Payer: Self-pay

## 2019-05-24 ENCOUNTER — Encounter (HOSPITAL_BASED_OUTPATIENT_CLINIC_OR_DEPARTMENT_OTHER)
Admission: RE | Admit: 2019-05-24 | Discharge: 2019-05-24 | Disposition: A | Discharge status: Home / Self Care | Payer: Medicare Other | Source: Ambulatory Visit | Attending: Obstetrics and Gynecology | Admitting: Obstetrics and Gynecology

## 2019-05-24 DIAGNOSIS — Z01818 Encounter for other preprocedural examination: Secondary | ICD-10-CM | POA: Insufficient documentation

## 2019-05-25 ENCOUNTER — Other Ambulatory Visit (HOSPITAL_COMMUNITY): Payer: Medicare Other | Attending: Obstetrics and Gynecology

## 2019-05-27 ENCOUNTER — Telehealth: Payer: Self-pay

## 2019-05-27 ENCOUNTER — Other Ambulatory Visit (HOSPITAL_COMMUNITY)
Admission: RE | Admit: 2019-05-27 | Discharge: 2019-05-27 | Disposition: A | Discharge status: Home / Self Care | Payer: Medicare Other | Source: Ambulatory Visit | Attending: Obstetrics and Gynecology | Admitting: Obstetrics and Gynecology

## 2019-05-27 ENCOUNTER — Other Ambulatory Visit: Payer: Self-pay | Admitting: Obstetrics and Gynecology

## 2019-05-27 DIAGNOSIS — Z20828 Contact with and (suspected) exposure to other viral communicable diseases: Secondary | ICD-10-CM | POA: Diagnosis not present

## 2019-05-27 DIAGNOSIS — Z01812 Encounter for preprocedural laboratory examination: Secondary | ICD-10-CM | POA: Insufficient documentation

## 2019-05-27 LAB — SARS CORONAVIRUS 2 (TAT 6-24 HRS): SARS Coronavirus 2: NEGATIVE

## 2019-05-27 MED ORDER — MISOPROSTOL 200 MCG PO TABS: ORAL_TABLET | ORAL | 1 refills | Status: DC

## 2019-05-27 NOTE — Telephone Encounter (Signed)
TC from pt requesting Rx to stop bleeding pt states she has upcoming surgery and was told to contact our office regarding Rx before surgery this week.

## 2019-05-28 ENCOUNTER — Other Ambulatory Visit: Payer: Self-pay

## 2019-05-28 DIAGNOSIS — R9389 Abnormal findings on diagnostic imaging of other specified body structures: Secondary | ICD-10-CM

## 2019-05-28 MED ORDER — MISOPROSTOL 200 MCG PO TABS: ORAL_TABLET | ORAL | 1 refills | Status: DC

## 2019-05-28 NOTE — Progress Notes (Signed)
Rx reordered to different pharmacy

## 2019-05-29 NOTE — Progress Notes (Signed)
Patient scheduled for D&C with hysteroscopy but reports experiencing shortness of breath with minimal exertion. Covid test negative. Patient reports a normal EKG with PCP and has been referred to a cardiologist for further evaluation. Case has been postponed until medically cleared.

## 2019-07-13 ENCOUNTER — Other Ambulatory Visit (HOSPITAL_COMMUNITY): Payer: Medicare Other

## 2019-07-17 ENCOUNTER — Ambulatory Visit (HOSPITAL_BASED_OUTPATIENT_CLINIC_OR_DEPARTMENT_OTHER)
Admission: RE | Admit: 2019-07-17 | Payer: Medicare Other | Source: Home / Self Care | Admitting: Obstetrics and Gynecology

## 2019-07-17 HISTORY — DX: Abnormal findings on diagnostic imaging of other specified body structures: R93.89

## 2019-07-17 SURGERY — DILATATION AND CURETTAGE /HYSTEROSCOPY
Anesthesia: Choice

## 2019-08-03 ENCOUNTER — Ambulatory Visit: Payer: Medicare Other | Attending: Internal Medicine

## 2019-08-03 DIAGNOSIS — Z23 Encounter for immunization: Secondary | ICD-10-CM | POA: Insufficient documentation

## 2019-08-03 NOTE — Progress Notes (Signed)
   Covid-19 Vaccination Clinic  Name:  Harvi Culver    MRN: [id number] DOB: [birth year]  08/03/2019  Ms. Stamer was observed post Covid-19 immunization for 15 minutes without incidence. She was provided with Vaccine Information Sheet and instruction to access the V-Safe system.   Ms. Herda was instructed to call 911 with any severe reactions post vaccine: Marland Kitchen Difficulty breathing  . Swelling of your face and throat  . A fast heartbeat  . A bad rash all over your body  . Dizziness and weakness    Immunizations Administered    Name Date Dose VIS Date Route   Pfizer COVID-19 Vaccine 08/03/2019  5:43 PM 0.3 mL 05/17/2019 Intramuscular   Manufacturer: [place]   Lot: [id number]   [place]: [id number]

## 2019-08-30 ENCOUNTER — Other Ambulatory Visit: Payer: Self-pay | Admitting: Obstetrics and Gynecology

## 2019-09-10 ENCOUNTER — Encounter (HOSPITAL_BASED_OUTPATIENT_CLINIC_OR_DEPARTMENT_OTHER): Payer: Self-pay | Admitting: Obstetrics and Gynecology

## 2019-09-10 ENCOUNTER — Other Ambulatory Visit: Payer: Self-pay

## 2019-09-14 ENCOUNTER — Other Ambulatory Visit (HOSPITAL_COMMUNITY): Payer: Medicare Other | Attending: Obstetrics and Gynecology

## 2019-09-18 ENCOUNTER — Ambulatory Visit (HOSPITAL_BASED_OUTPATIENT_CLINIC_OR_DEPARTMENT_OTHER)
Admission: RE | Admit: 2019-09-18 | Payer: Medicare Other | Source: Home / Self Care | Admitting: Obstetrics and Gynecology

## 2019-09-18 SURGERY — DILATATION AND CURETTAGE /HYSTEROSCOPY
Anesthesia: Choice

## 2019-10-24 ENCOUNTER — Other Ambulatory Visit: Payer: Self-pay | Admitting: Family Medicine

## 2019-10-24 DIAGNOSIS — Z1231 Encounter for screening mammogram for malignant neoplasm of breast: Secondary | ICD-10-CM

## 2019-10-26 ENCOUNTER — Other Ambulatory Visit: Payer: Self-pay | Admitting: Obstetrics and Gynecology

## 2019-11-18 ENCOUNTER — Other Ambulatory Visit: Payer: Self-pay

## 2019-11-18 ENCOUNTER — Ambulatory Visit
Admission: RE | Admit: 2019-11-18 | Discharge: 2019-11-18 | Disposition: A | Discharge status: Home / Self Care | Payer: Medicare Other | Source: Ambulatory Visit | Attending: Family Medicine | Admitting: Family Medicine

## 2019-11-18 DIAGNOSIS — Z1231 Encounter for screening mammogram for malignant neoplasm of breast: Secondary | ICD-10-CM

## 2020-01-01 ENCOUNTER — Encounter (HOSPITAL_BASED_OUTPATIENT_CLINIC_OR_DEPARTMENT_OTHER): Payer: Self-pay

## 2020-01-01 ENCOUNTER — Ambulatory Visit (HOSPITAL_BASED_OUTPATIENT_CLINIC_OR_DEPARTMENT_OTHER): Admit: 2020-01-01 | Payer: Medicare Other | Admitting: Obstetrics and Gynecology

## 2020-01-01 SURGERY — DILATATION AND CURETTAGE /HYSTEROSCOPY
Anesthesia: Choice

## 2020-01-20 ENCOUNTER — Ambulatory Visit: Payer: Medicare Other | Admitting: Obstetrics and Gynecology

## 2020-06-27 ENCOUNTER — Inpatient Hospital Stay (HOSPITAL_COMMUNITY)
Admission: EM | Admit: 2020-06-27 | Discharge: 2020-07-04 | DRG: 330 | Disposition: A | Discharge status: Home Health | Payer: Medicare Other | Attending: Internal Medicine | Admitting: Internal Medicine

## 2020-06-27 ENCOUNTER — Encounter (HOSPITAL_COMMUNITY): Payer: Self-pay

## 2020-06-27 ENCOUNTER — Other Ambulatory Visit: Payer: Self-pay

## 2020-06-27 DIAGNOSIS — R1900 Intra-abdominal and pelvic swelling, mass and lump, unspecified site: Secondary | ICD-10-CM

## 2020-06-27 DIAGNOSIS — E876 Hypokalemia: Secondary | ICD-10-CM | POA: Diagnosis not present

## 2020-06-27 DIAGNOSIS — Z7984 Long term (current) use of oral hypoglycemic drugs: Secondary | ICD-10-CM

## 2020-06-27 DIAGNOSIS — Z20822 Contact with and (suspected) exposure to covid-19: Secondary | ICD-10-CM | POA: Diagnosis present

## 2020-06-27 DIAGNOSIS — Z79899 Other long term (current) drug therapy: Secondary | ICD-10-CM

## 2020-06-27 DIAGNOSIS — Z880 Allergy status to penicillin: Secondary | ICD-10-CM

## 2020-06-27 DIAGNOSIS — Z8041 Family history of malignant neoplasm of ovary: Secondary | ICD-10-CM

## 2020-06-27 DIAGNOSIS — Z833 Family history of diabetes mellitus: Secondary | ICD-10-CM

## 2020-06-27 DIAGNOSIS — Z886 Allergy status to analgesic agent status: Secondary | ICD-10-CM

## 2020-06-27 DIAGNOSIS — Z8616 Personal history of COVID-19: Secondary | ICD-10-CM

## 2020-06-27 DIAGNOSIS — Z888 Allergy status to other drugs, medicaments and biological substances status: Secondary | ICD-10-CM

## 2020-06-27 DIAGNOSIS — K56609 Unspecified intestinal obstruction, unspecified as to partial versus complete obstruction: Secondary | ICD-10-CM | POA: Diagnosis not present

## 2020-06-27 DIAGNOSIS — Z7982 Long term (current) use of aspirin: Secondary | ICD-10-CM

## 2020-06-27 DIAGNOSIS — E669 Obesity, unspecified: Secondary | ICD-10-CM | POA: Diagnosis present

## 2020-06-27 DIAGNOSIS — C8583 Other specified types of non-Hodgkin lymphoma, intra-abdominal lymph nodes: Secondary | ICD-10-CM | POA: Diagnosis present

## 2020-06-27 DIAGNOSIS — G4733 Obstructive sleep apnea (adult) (pediatric): Secondary | ICD-10-CM | POA: Diagnosis present

## 2020-06-27 DIAGNOSIS — E1141 Type 2 diabetes mellitus with diabetic mononeuropathy: Secondary | ICD-10-CM | POA: Diagnosis present

## 2020-06-27 DIAGNOSIS — D509 Iron deficiency anemia, unspecified: Secondary | ICD-10-CM | POA: Diagnosis present

## 2020-06-27 LAB — URINALYSIS, ROUTINE W REFLEX MICROSCOPIC
Bilirubin Urine: NEGATIVE
Glucose, UA: NEGATIVE mg/dL
Hgb urine dipstick: NEGATIVE
Ketones, ur: 20 mg/dL — AB
Nitrite: NEGATIVE
Protein, ur: 100 mg/dL — AB
Specific Gravity, Urine: 1.028 (ref 1.005–1.030)
pH: 5 (ref 5.0–8.0)

## 2020-06-27 LAB — COMPREHENSIVE METABOLIC PANEL
ALT: 14 U/L (ref 0–44)
AST: 18 U/L (ref 15–41)
Albumin: 3.9 g/dL (ref 3.5–5.0)
Alkaline Phosphatase: 64 U/L (ref 38–126)
Anion gap: 16 — ABNORMAL HIGH (ref 5–15)
BUN: 8 mg/dL (ref 8–23)
CO2: 19 mmol/L — ABNORMAL LOW (ref 22–32)
Calcium: 9.7 mg/dL (ref 8.9–10.3)
Chloride: 105 mmol/L (ref 98–111)
Creatinine, Ser: 0.85 mg/dL (ref 0.44–1.00)
GFR, Estimated: >60 mL/min (ref >60)
Glucose, Bld: 165 mg/dL — ABNORMAL HIGH (ref 70–99)
Potassium: 3.7 mmol/L (ref 3.5–5.1)
Sodium: 140 mmol/L (ref 135–145)
Total Bilirubin: 0.7 mg/dL (ref 0.3–1.2)
Total Protein: <3 g/dL — ABNORMAL LOW (ref 6.5–8.1)

## 2020-06-27 LAB — CBC
HCT: 39.4 % (ref 36.0–46.0)
Hemoglobin: 12.6 g/dL (ref 12.0–15.0)
MCH: 28.6 pg (ref 26.0–34.0)
MCHC: 32 g/dL (ref 30.0–36.0)
MCV: 89.3 fL (ref 80.0–100.0)
Platelets: 226 10*3/uL (ref 150–400)
RBC: 4.41 MIL/uL (ref 3.87–5.11)
RDW: 15.2 % (ref 11.5–15.5)
WBC: 6.7 10*3/uL (ref 4.0–10.5)
nRBC: 0 % (ref 0.0–0.2)

## 2020-06-27 LAB — LIPASE, BLOOD: Lipase: 23 U/L (ref 11–51)

## 2020-06-27 MED ORDER — ONDANSETRON HCL 4 MG/2ML IJ SOLN
4.0000 mg | Freq: Once | INTRAMUSCULAR | Status: AC
  Administered 2020-06-28: 4 mg via INTRAVENOUS
  Filled 2020-06-27: qty 2

## 2020-06-27 MED ORDER — FENTANYL CITRATE (PF) 100 MCG/2ML IJ SOLN
50.0000 ug | Freq: Once | INTRAMUSCULAR | Status: AC
Start: 2020-06-28 — End: 2020-06-28
  Administered 2020-06-28: 50 ug via INTRAVENOUS
  Filled 2020-06-27: qty 2

## 2020-06-27 NOTE — ED Provider Notes (Signed)
[place] EMERGENCY DEPARTMENT Provider Note   CSN: [id number] Arrival date & time: 06/27/20  1724     History Chief Complaint  Patient presents with  . Abdominal Pain    Abigail Houston is a 70 y.o. female.  The history is provided by the patient and medical records.  Abdominal Pain Associated symptoms: nausea and vomiting     70 year old female with history of arthritis, cataracts, diabetes, sleep apnea, presenting to the ED with abdominal pain.  This has been ongoing for several months but worse over the past few weeks.  States she has seen her doctor twice already this month and has had blood work done without any answers.  States neck step was to get her in for a CT scan but this has not been done yet.  She reports for several weeks now she has not been able to eat solid food, has been on liquid diet.  States even she does eat or drink anything such as Jell-O or chicken broth she feels like it gets stuck somewhere in her chest and then she vomits.  This happens at least 1-2x weekly.  She did have diarrhea 2 days ago, none today.  She has had ongoing lower abdominal cramping.  No fever, chills, sweats.  No chest pain or SOB.  No cough or URI symptoms.  Prior surgeries include c-section.  States her doctor has put her on zofran and other medications but does not seem to help her symptoms.  Past Medical History:  Diagnosis Date  . Arthritis   . Cataract   . Diabetes mellitus without complication ([place])   . Sleep apnea    does not wear CPAP  . Snoring   . Thickened endometrium     Patient Active Problem List   Diagnosis Date Noted  . Ankle edema, bilateral 02/20/2018  . Nocturia more than twice per night 02/20/2018  . Obesity (BMI 35.0-39.9 without comorbidity) 02/20/2018  . Type 2 diabetes mellitus with diabetic mononeuropathy, without long-term current use of insulin ([place]) 02/20/2018  . Snoring 02/20/2018  . Varicose veins of bilateral lower extremities  with other complications [id number]  . Slow transit constipation 03/24/2016    Past Surgical History:  Procedure Laterality Date  . arm fracture  1984   left  . CESAREAN SECTION     one time  . EYE SURGERY  07/2017   right eye cataract surgery  . varicose veins  11/2017   left leg     OB History    Gravida  6   Para  5   Term  5   Preterm      AB  1   Living  2     SAB      IAB  1   Ectopic      Multiple      Live Births  3           Family History  Problem Relation Age of Onset  . Ovarian cancer Mother   . Diabetes Sister   . Heart disease Brother   . Colon cancer Neg Hx   . Esophageal cancer Neg Hx   . Stomach cancer Neg Hx   . Rectal cancer Neg Hx   . Colon polyps Neg Hx     Social History   Tobacco Use  . Smoking status: Never Smoker  . Smokeless tobacco: Never Used  Vaping Use  . Vaping Use: Never used  Substance Use Topics  .  Alcohol use: Yes    Alcohol/week: 1.0 standard drink    Types: 1 Cans of beer per week    Comment: occasional  . Drug use: No    Home Medications Prior to Admission medications   Medication Sig Start Date End Date Taking? Authorizing Provider  aspirin EC 81 MG tablet Take 81 mg by mouth daily.    [provider]  CALCIUM PO Take by mouth.    [provider]  cholecalciferol (VITAMIN D) 1000 units tablet Take 1,000 Units by mouth daily.    [provider]  ezetimibe (ZETIA) 10 MG tablet Take 10 mg by mouth daily.    [provider]  furosemide (LASIX) 20 MG tablet Take by mouth. 10/19/17   [provider]  metFORMIN (GLUCOPHAGE) 500 MG tablet Take by mouth. Take 2 pills in am and one pill at night    [provider]  Multiple Vitamins-Minerals (CENTRUM SILVER PO) Take by mouth daily.    [provider]  Omega-3 Fatty Acids (FISH OIL) 1000 MG CAPS Take by mouth daily.    [provider]  VITAMIN E PO Take by mouth.    [provider]    Allergies    Motrin [ibuprofen], Pravastatin, Ampicillin, and Penicillins  Review of Systems   Review of Systems  Gastrointestinal: Positive for abdominal pain, nausea and vomiting.  All other systems reviewed and are negative.   Physical Exam Updated Vital Signs BP 131/89   Pulse 88   Temp 98.5 F (36.9 C)   Resp (!) 22   SpO2 100%   Physical Exam Vitals and nursing note reviewed.  Constitutional:      Appearance: She is well-developed and well-nourished.  HENT:     Head: Normocephalic and atraumatic.     Mouth/Throat:     Mouth: Oropharynx is clear and moist.  Eyes:     Extraocular Movements: EOM normal.     Conjunctiva/sclera: Conjunctivae normal.     Pupils: Pupils are equal, round, and reactive to light.  Cardiovascular:     Rate and Rhythm: Normal rate and regular rhythm.     Heart sounds: Normal heart sounds.  Pulmonary:     Effort: Pulmonary effort is normal.     Breath sounds: Normal breath sounds.  Abdominal:     General: Bowel sounds are normal.     Palpations: Abdomen is soft.     Tenderness: There is abdominal tenderness in the epigastric area. There is no rebound. Negative signs include McBurney's sign.  Musculoskeletal:        General: Normal range of motion.     Cervical back: Normal range of motion.  Skin:    General: Skin is warm and dry.  Neurological:     Mental Status: She is alert and oriented to person, place, and time.  Psychiatric:        Mood and Affect: Mood and affect normal.     ED Results / Procedures / Treatments   Labs (all labs ordered are listed, but only abnormal results are displayed) Labs Reviewed  COMPREHENSIVE METABOLIC PANEL - Abnormal; Notable for the following components:      Result Value   CO2 19 (*)    Glucose, Bld 165 (*)    Total Protein <3.0 (*)    Anion gap 16 (*)    All other components within normal limits  URINALYSIS, ROUTINE W REFLEX MICROSCOPIC - Abnormal; Notable for the following  components:   Color,  Urine AMBER (*)    APPearance CLOUDY (*)    Ketones, ur 20 (*)    Protein, ur 100 (*)    Leukocytes,Ua SMALL (*)    Bacteria, UA FEW (*)    All other components within normal limits  SARS CORONAVIRUS 2 BY RT PCR (HOSPITAL ORDER, [facility])  LIPASE, BLOOD  CBC    EKG None  Radiology CT ABDOMEN PELVIS W CONTRAST  Result Date: 06/28/2020 CLINICAL DATA:  Epigastric pain and suprapubic pain. EXAM: CT ABDOMEN AND PELVIS WITH CONTRAST TECHNIQUE: Multidetector CT imaging of the abdomen and pelvis was performed using the standard protocol following bolus administration of intravenous contrast. CONTRAST:  15mL OMNIPAQUE IOHEXOL 300 MG/ML  SOLN COMPARISON:  None. FINDINGS: Lower chest: Lung bases are clear. Hepatobiliary: No focal liver abnormality is seen. No gallstones, gallbladder wall thickening, or biliary dilatation. Pancreas: Unremarkable. No pancreatic ductal dilatation or surrounding inflammatory changes. Spleen: Normal in size without focal abnormality. Adrenals/Urinary Tract: Adrenal glands are unremarkable. Kidneys are normal, without renal calculi, focal lesion, or hydronephrosis. Bladder is unremarkable. Stomach/Bowel: Stomach is unremarkable. Dilated fluid-filled small bowel with mildly thickened wall. Decompressed terminal ileum with transition zone in the left abdomen. Mild mesenteric edema. Changes likely represent small bowel obstruction. Enteritis may also be present. Colon is decompressed with scattered colonic diverticula. No evidence of diverticulitis. Appendix is normal. Vascular/Lymphatic: Prominent mesenteric lymphadenopathy with anterior pericaval lymphadenopathy. Pericaval node measures 2.8 cm short axis dimension. Mesenteric nodes measure up to 3.1 cm short axis dimension. Appearances are suspicious for lymphoma. Metastasis less likely. Reproductive: Endometrial fluid and thickening out of proportion to the patient's age.  Suggestion of a right adnexal mass. Free fluid in the pelvis. Pelvic ultrasound is suggested for further characterization. Other: No free air.  Abdominal wall musculature appears intact. Musculoskeletal: No acute or significant osseous findings. IMPRESSION: 1. Small bowel obstruction with wall thickening and mesenteric edema, possibly also enteritis. 2. Prominent mesenteric and anterior pericaval lymphadenopathy. Appearances are suspicious for lymphoma. Metastasis less likely. 3. Endometrial fluid and thickening out of proportion to the patient's age. Right adnexal mass. Pelvic ultrasound is suggested for further characterization. 4. Free fluid in the pelvis. Electronically Signed   By: Lucienne Capers M.D.   On: 06/28/2020 01:13    Procedures Procedures (including critical care time)  Medications Ordered in ED Medications  fentaNYL (SUBLIMAZE) injection 50 mcg (50 mcg Intravenous Given 06/28/20 0015)  ondansetron (ZOFRAN) injection 4 mg (4 mg Intravenous Given 06/28/20 0016)  iohexol (OMNIPAQUE) 300 MG/ML solution 100 mL (100 mLs Intravenous Contrast Given 06/28/20 0057)    ED Course  I have reviewed the triage vital signs and the nursing notes.  Pertinent labs & imaging results that were available during my care of the patient were reviewed by me and considered in my medical decision making (see chart for details).    MDM Rules/Calculators/A&P  70 year old female presenting to the ED with abdominal pain.  This is been ongoing for several months but worse over the past 2 weeks.  Has been seen by PCP twice and plan for CT scan but has not had this done yet.  States she has been on a liquid diet for a few weeks now but still has some intermittent vomiting.  She had diarrhea 2 days ago but none since then.  No fever or chills.  She is afebrile and nontoxic in appearance here.  Does have some tenderness to the epigastrium.  Her labs are grossly reassuring.  We will proceed with CT scan.  CT  obtained--has evidence of small bowel obstruction +/- enteritis.  Has some prominent mesenteric and anterior pericaval lymphadenopathy.  Uncertain significance of this.  Also has endometrial fluid and thickening out of proportion for age (history of same noted in chart).  Denies unexplained fever, rapid weight loss over the past few months, etc.  Will place NG tube.  Will admit for ongoing care.  Discuss with teaching service-- they will admit for ongoing care.  General surgery made aware of patient, will provide consultation.    Final Clinical Impression(s) / ED Diagnoses Final diagnoses:  SBO (small bowel obstruction) [facility])    Rx / DC Orders ED Discharge Orders    None       Larene Pickett, PA-C 06/28/20 0147    Orpah Greek, MD 06/28/20 [phone]

## 2020-06-27 NOTE — ED Triage Notes (Addendum)
Pt presents with epigastric pain >month, been to PCP several times. Yesterday they collected blood samples and scheduled her for a CT scan of her abd for next Friday. Pt reports pain is too much. Pt also c/o constipation. Last BM was Thursday

## 2020-06-28 ENCOUNTER — Emergency Department (HOSPITAL_COMMUNITY): Payer: Medicare Other

## 2020-06-28 ENCOUNTER — Inpatient Hospital Stay (HOSPITAL_COMMUNITY): Payer: Medicare Other

## 2020-06-28 DIAGNOSIS — Z886 Allergy status to analgesic agent status: Secondary | ICD-10-CM | POA: Diagnosis not present

## 2020-06-28 DIAGNOSIS — E1141 Type 2 diabetes mellitus with diabetic mononeuropathy: Secondary | ICD-10-CM | POA: Diagnosis present

## 2020-06-28 DIAGNOSIS — C8583 Other specified types of non-Hodgkin lymphoma, intra-abdominal lymph nodes: Secondary | ICD-10-CM | POA: Diagnosis present

## 2020-06-28 DIAGNOSIS — D509 Iron deficiency anemia, unspecified: Secondary | ICD-10-CM | POA: Diagnosis present

## 2020-06-28 DIAGNOSIS — C9150 Adult T-cell lymphoma/leukemia (HTLV-1-associated) not having achieved remission: Secondary | ICD-10-CM | POA: Diagnosis not present

## 2020-06-28 DIAGNOSIS — K56609 Unspecified intestinal obstruction, unspecified as to partial versus complete obstruction: Secondary | ICD-10-CM | POA: Diagnosis present

## 2020-06-28 DIAGNOSIS — Z888 Allergy status to other drugs, medicaments and biological substances status: Secondary | ICD-10-CM | POA: Diagnosis not present

## 2020-06-28 DIAGNOSIS — G4733 Obstructive sleep apnea (adult) (pediatric): Secondary | ICD-10-CM | POA: Diagnosis present

## 2020-06-28 DIAGNOSIS — E876 Hypokalemia: Secondary | ICD-10-CM | POA: Diagnosis not present

## 2020-06-28 DIAGNOSIS — Z20822 Contact with and (suspected) exposure to covid-19: Secondary | ICD-10-CM | POA: Diagnosis present

## 2020-06-28 DIAGNOSIS — Z7982 Long term (current) use of aspirin: Secondary | ICD-10-CM | POA: Diagnosis not present

## 2020-06-28 DIAGNOSIS — Z8041 Family history of malignant neoplasm of ovary: Secondary | ICD-10-CM | POA: Diagnosis not present

## 2020-06-28 DIAGNOSIS — R1900 Intra-abdominal and pelvic swelling, mass and lump, unspecified site: Secondary | ICD-10-CM | POA: Diagnosis present

## 2020-06-28 DIAGNOSIS — Z7984 Long term (current) use of oral hypoglycemic drugs: Secondary | ICD-10-CM | POA: Diagnosis not present

## 2020-06-28 DIAGNOSIS — Z833 Family history of diabetes mellitus: Secondary | ICD-10-CM | POA: Diagnosis not present

## 2020-06-28 DIAGNOSIS — E119 Type 2 diabetes mellitus without complications: Secondary | ICD-10-CM | POA: Diagnosis not present

## 2020-06-28 DIAGNOSIS — Z880 Allergy status to penicillin: Secondary | ICD-10-CM | POA: Diagnosis not present

## 2020-06-28 DIAGNOSIS — Z8616 Personal history of COVID-19: Secondary | ICD-10-CM | POA: Diagnosis not present

## 2020-06-28 DIAGNOSIS — E669 Obesity, unspecified: Secondary | ICD-10-CM | POA: Diagnosis present

## 2020-06-28 DIAGNOSIS — Z79899 Other long term (current) drug therapy: Secondary | ICD-10-CM | POA: Diagnosis not present

## 2020-06-28 LAB — CBC
HCT: 38.4 % (ref 36.0–46.0)
Hemoglobin: 11.8 g/dL — ABNORMAL LOW (ref 12.0–15.0)
MCH: 28 pg (ref 26.0–34.0)
MCHC: 30.7 g/dL (ref 30.0–36.0)
MCV: 91 fL (ref 80.0–100.0)
Platelets: 197 10*3/uL (ref 150–400)
RBC: 4.22 MIL/uL (ref 3.87–5.11)
RDW: 15.3 % (ref 11.5–15.5)
WBC: 6.2 10*3/uL (ref 4.0–10.5)
nRBC: 0 % (ref 0.0–0.2)

## 2020-06-28 LAB — BASIC METABOLIC PANEL
Anion gap: 11 (ref 5–15)
BUN: 8 mg/dL (ref 8–23)
CO2: 23 mmol/L (ref 22–32)
Calcium: 9.1 mg/dL (ref 8.9–10.3)
Chloride: 103 mmol/L (ref 98–111)
Creatinine, Ser: 0.82 mg/dL (ref 0.44–1.00)
GFR, Estimated: >60 mL/min (ref >60)
Glucose, Bld: 125 mg/dL — ABNORMAL HIGH (ref 70–99)
Potassium: 3.4 mmol/L — ABNORMAL LOW (ref 3.5–5.1)
Sodium: 137 mmol/L (ref 135–145)

## 2020-06-28 LAB — HIV ANTIBODY (ROUTINE TESTING W REFLEX): HIV Screen 4th Generation wRfx: NONREACTIVE

## 2020-06-28 LAB — CBG MONITORING, ED
Glucose-Capillary: 102 mg/dL — ABNORMAL HIGH (ref 70–99)
Glucose-Capillary: 81 mg/dL (ref 70–99)

## 2020-06-28 LAB — HEMOGLOBIN A1C
Hgb A1c MFr Bld: 6.9 % — ABNORMAL HIGH (ref 4.8–5.6)
Mean Plasma Glucose: 151.33 mg/dL

## 2020-06-28 LAB — SARS CORONAVIRUS 2 BY RT PCR (HOSPITAL ORDER, PERFORMED IN ~~LOC~~ HOSPITAL LAB): SARS Coronavirus 2: POSITIVE — AB

## 2020-06-28 LAB — MAGNESIUM: Magnesium: 1.7 mg/dL (ref 1.7–2.4)

## 2020-06-28 MED ORDER — ONDANSETRON HCL 4 MG PO TABS: 4.0000 mg | ORAL_TABLET | Freq: Four times a day (QID) | ORAL | Status: DC | PRN

## 2020-06-28 MED ORDER — ONDANSETRON HCL 4 MG/2ML IJ SOLN
4.0000 mg | Freq: Four times a day (QID) | INTRAMUSCULAR | Status: DC | PRN
  Administered 2020-06-30: 4 mg via INTRAVENOUS
  Filled 2020-06-28: qty 2

## 2020-06-28 MED ORDER — HYDROMORPHONE HCL 1 MG/ML IJ SOLN
0.5000 mg | INTRAMUSCULAR | Status: DC | PRN
  Administered 2020-06-29 – 2020-07-01 (×6): 1 mg via INTRAVENOUS
  Filled 2020-06-28 (×7): qty 1

## 2020-06-28 MED ORDER — POTASSIUM CHLORIDE 10 MEQ/100ML IV SOLN
10.0000 meq | INTRAVENOUS | Status: AC
  Administered 2020-06-28 (×3): 10 meq via INTRAVENOUS
  Filled 2020-06-28 (×3): qty 100

## 2020-06-28 MED ORDER — IOHEXOL 300 MG/ML  SOLN
100.0000 mL | Freq: Once | INTRAMUSCULAR | Status: AC | PRN
  Administered 2020-06-28: 100 mL via INTRAVENOUS

## 2020-06-28 MED ORDER — LACTATED RINGERS IV SOLN: INTRAVENOUS | Status: DC

## 2020-06-28 MED ORDER — HEPARIN SODIUM (PORCINE) 5000 UNIT/ML IJ SOLN
5000.0000 [IU] | Freq: Three times a day (TID) | INTRAMUSCULAR | Status: DC
  Administered 2020-06-28 – 2020-06-29 (×4): 5000 [IU] via SUBCUTANEOUS
  Filled 2020-06-28 (×4): qty 1

## 2020-06-28 NOTE — H&P (Signed)
Date: 06/28/2020               Patient Name:  Abigail Houston MRN: [id number]  DOB: 1950-07-02 Age / Sex: 70 y.o., female   PCP: Bernerd Limbo, MD         Medical Service: Internal Medicine Teaching Service         Attending Physician: Dr. Jimmye Norman, Elaina Pattee, MD    First Contact: Dr. Sanjuana Letters Pager: [phone]  Second Contact: Dr. Maudie Mercury Pager: [phone]       After Hours (After 5p/  First Contact Pager: [phone]  weekends / holidays): Second Contact Pager: [phone]   Chief Complaint: Abdominal pain and N/V  History of Present Illness: Abigail Houston is a 70 yo female with PMH of arthritis, T2DM, IDA, and sleep apnea who presents to [facility] with a complaint of abdominal pain, nausea and vomiting. Patient states she has had abdominal pain described as cramping in the lower abdomen and tightness in the upper abdomen on and off for the past few months but worse over the past few weeks. She has had associated constipation, nausea and vomiting. Patient states she throws up after eating solid foods so she has been on a liquid diet.  In the last week, she has vomited twice even after eating Jell-O or chicken broth. She has tried Pepto-Bismol which has help with the nausea. She had an episode of diarrhea on Thursday and that was her last bowel movement. Patient denies any bloody stools, dizziness, chest pain, back pain, pelvic pain, dysuria, hematuria, vaginal bleeding or discharge.  Of note, patient states she has seen her PCP multiple times for abdominal pain in the last few months. She has been prescribed Zofran, MiraLAX and a bland diet to help control her symptoms however the symptoms have worsened this week. She last saw her PCP 5 days ago. They drew some blood, asked her to try PPI scheduled her for abdominal CT scan with follow-up in 2 weeks.  Patient came to the ED because symptoms worsened this week.  PCP: Phineas Inches, MD, General (Family Medicine)  Meds:  Current Meds   Medication Sig  . aspirin EC 81 MG tablet Take 81 mg by mouth daily.  Marland Kitchen ezetimibe (ZETIA) 10 MG tablet Take 10 mg by mouth at bedtime.  . ferrous sulfate 325 (65 FE) MG tablet Take 325 mg by mouth daily with breakfast.  . metFORMIN (GLUCOPHAGE) 500 MG tablet Take 500 mg by mouth in the morning and at bedtime.  . ondansetron (ZOFRAN-ODT) 8 MG disintegrating tablet Take 8 mg by mouth every 8 (eight) hours as needed for nausea or vomiting.  . vitamin B-12 (CYANOCOBALAMIN) 1000 MCG tablet Take 1,000 mcg by mouth daily.  . vitamin C (ASCORBIC ACID) 500 MG tablet Take 500 mg by mouth daily.  . [DISCONTINUED] polyethylene glycol powder (GLYCOLAX/MIRALAX) 17 GM/SCOOP powder Take 17 g by mouth daily.    Allergies: Allergies as of 06/27/2020 - Review Complete 06/27/2020  Allergen Reaction Noted  . Motrin [ibuprofen] Hives 06/14/2016  . Pravastatin Other (See Comments) 07/11/2017  . Ampicillin Hives 04/09/2018  . Penicillins Hives and Swelling 04/09/2018   Past Medical History:  Diagnosis Date  . Arthritis   . Cataract   . Diabetes mellitus without complication ([place])   . Sleep apnea    does not wear CPAP  . Snoring   . Thickened endometrium     Family History: Significant for mother who passed away from  ovarian cancer at the age of 71. Hypertension and diabetes in father and 2 daughters with hypertension.  Social History: Lives by herself and does her own ADLs. 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 Usually drinks a glass of red wine with her meals and occasional shot of [place]y. Denies tobacco use or illicit drug use.  Review of Systems: A complete ROS was negative except as per HPI.  Physical Exam: Blood pressure 128/87, pulse 81, temperature 98.5 F (36.9 C), resp. rate 11, SpO2 99 %.  General: Pleasant, well-appearing middle-age elderly woman laying in bed. No acute distress. CV: RRR. No murmurs, rubs, or gallops. No  LE edema Pulmonary: Lungs CTAB. Normal effort. No wheezing or rales. Abdominal: Soft, nontender. Moderately distended. Hyperresonance to percussion. Hypoactive bowel sounds. No organomegaly. No adnexal tenderness.  Extremities: Palpable dorsalis pedis and radialis pulses. Normal ROM. Skin: Warm and dry. No obvious rash or lesions. Neuro: A&Ox3. Moves all extremities. Normal sensation. No focal deficit. Psych: Normal mood and affect  CT abdomen/pelvis w/ contrast: Small bowel obstruction with wall thickening and mesenteric edema. Mesenteric and anterior pericaval lymphadenopathy suspicious for lymphoma. Right adnexal mass, endometrial fluid and thickening and free fluid in the pelvis.  Assessment & Plan by Problem: Active Problems:   SBO (small bowel obstruction) ([place])  Abigail Houston is a 70 yo female with PMH of arthritis, IDA, T2DM, and sleep apnea who present presented for evaluation of worsening abdominal pain, nausea and vomiting and found to have small bowel obstruction. Currently undergoing abdominal decompression and pending surgery eval.  #Small bowel obstruction Patient with abdominal discomfort for last few months that has worsened over the last week presented with abdominal cramping and tightness with associated nausea and vomiting. CT abd/pel showed bowel changes consistent with small bowel obstruction and possible enteritis. There is no evidence of abdominal hernia, multiple surgeries or foreign body ingestion. SBO likely secondary to enteritis versus possible neoplasm in the setting of family history of ovarian cancer and recent imaging showing thickened endometrium and right adnexal mass.  CBC unremarkable, lipase normal and CMP significant for low protein but no electrolyte abnormalities. Patient hemodynamically stable. Abdominal pain has resolved. Continue decompression with NG tube and monitor for increased bowel function. --NG tube for abdominal decompression --IV LR at 75  mL/hr --IV Dilaudid 0.5-1 mg q2h prn for pain --Zofran 4 mg q4h prn for nausea --Surgery eval in AM --Consider nutrition consult --Monitor electrolytes closely  #T2DM Well-controlled. Last A1c of 6.9 on 02/25/20. On metformin 500 mg twice daily. Patient monitors blood sugar at home with a CGM. Last CBG of 99.  --CBG monitoring q8h --Follow-up repeat A1c  #Right adnexal mass CT abd/pel patient showed thickened endometrium and right adnexal mass, mesenteric and anterior pericaval lymphadenopathy suspicious for lymphoma. Pelvic ultrasound on 12/14/18 showed normal uterus but thickened and heterogeneous endometrial strip measuring 1.2 cm with a questionable 1.4 cm hyperechoic endometrial mass/polyp. Patient has had multiple D&Cs canceled due to [place]. Patient denies any pelvic pain or vaginal bleeding.  --Last colonoscopy 12/21/18 showed multiple pancolonic diverticula.  --Follow-up with OB/GYN for [facility] in the outpatient --Consider repeat pelvic ultrasound    #OSA Stable. On chart review, patient supposed to be on CPAP at night. Patient denies shortness of breath or cough. Satting well on RA.  --Consider CPAP at night during hospitalization  #Iron deficiency anemia Patient has a history of iron deficiency anemia on supplementation with ferrous sulfate 325 mg  daily. Hemoglobin currently normal at 12.6. Continue to monitor closely. --Follow-up morning CBC  CODE STATUS: Full code DIET: NPO PPx: Subcu heparin  Dispo: Admit patient to Inpatient with expected length of stay greater than 2 midnights.  Signed: Lacinda Axon, MD 06/28/2020, 2:18 AM  Pager: [phone] Internal Medicine Teaching Service After 5pm on weekdays and 1pm on weekends: On Call pager: [phone]

## 2020-06-28 NOTE — Progress Notes (Signed)
Subjective:  ON events: Admitted  Abigail Houston was seen and evaluated at bedside. She feels better this morning compared to yesterday. She attempted the NG tube, but could not tolerate it. She has had no vomiting/nausea episodes. Denies night sweats, weight loss, myalgias, fevers. her last BM was last Thursday, but it was diarrhea. She endorses her usual BM is one time every three days or so depending on what she eats. She is not passing gas at this time. All questions and concerns addressed at bedside.   Objective:  Vital signs in last 24 hours: Vitals:   06/28/20 0500 06/28/20 0600 06/28/20 0646 06/28/20 0700  BP: 133/88 119/73  121/70  Pulse: 79 69 74 78  Resp: 16 16 18 20   Temp:   98.2 F (36.8 C)   TempSrc:   Oral   SpO2: 96% 97% 99% 97%   Constitutional: well-appearing, resting comfortably in bed.  Cardiovascular: regular rate and rhythm, no m/r/g Pulmonary/Chest: normal work of breathing on room air, lungs clear to auscultation bilaterally Abdominal: soft, distended, resonant to percussion of upper abdomen. Absent bowel sounds. Tender LUQ with deep palpation.  MSK: normal bulk and tone Neurological: alert & oriented x 3 Skin: warm and dry Psych: Calm mood   CBC Latest Ref Rng & Units 06/28/2020 06/27/2020 10/29/2016  WBC 4.0 - 10.5 K/uL 6.2 6.7 5.7  Hemoglobin 12.0 - 15.0 g/dL 11.8(L) 12.6 12.3  Hematocrit 36.0 - 46.0 % 38.4 39.4 38.7  Platelets 150 - 400 K/uL 197 226 148(L)   BMP Latest Ref Rng & Units 06/28/2020 06/27/2020 10/29/2016  Glucose 70 - 99 mg/dL 125(H) 165(H) 139(H)  BUN 8 - 23 mg/dL 8 8 8   Creatinine 0.44 - 1.00 mg/dL 0.82 0.85 0.64  Sodium 135 - 145 mmol/L 137 140 138  Potassium 3.5 - 5.1 mmol/L 3.4(L) 3.7 3.7  Chloride 98 - 111 mmol/L 103 105 108  CO2 22 - 32 mmol/L 23 19(L) 21(L)  Calcium 8.9 - 10.3 mg/dL 9.1 9.7 8.6(L)     Assessment/Plan:  Abigail Houston is a 70 yo female with PMH of arthritis, IDA, T2DM, and sleep apnea who present presented for  evaluation of worsening abdominal pain, nausea and vomiting and found to have small bowel obstruction.   #Small bowel obstruction Patient denies nausea and vomiting. She is not passing gas at this time. On exam, she is without bowel sounds and is tender to palpate in the LUQ. Unclear etiology, adhesions vs possible lymphadenopathy. Patient without classic B symptoms athtis time "night sweats, fevers, weight loss." Patient was scheduled for transvaginal US for adnexal mass, will order that while patient is admitted and awaiting advancement. If she does not show improvement or worsens, will consider Gyn/Onc consult.  -continue to monitor for flatus and passing bowels -NG tube if begins having active vomiting -NPO -IV LR at 75 mL/hr -IV Dilaudid 0.5-1 mg q2h prn for pain -Zofran 4 mg q4h prn for nausea -surgery following -daily BMP  #T2DM --CBG monitoring q4h in setting of NPO status --D5 if patient becomes hypoglycemic --Follow-up repeat A1c  #Right adnexal mass Transvaginal ultrasound ordered.  -results pending -for now, follow up outpatient with gyn provider    #OSA Stable. On chart review, patient supposed to be on CPAP at night. Patient denies shortness of breath or cough. Satting well on RA. If becomes hypoxic in the evening, consider ordering CPAP.   #Iron deficiency anemia Patient has a history of iron deficiency anemia on supplementation with ferrous sulfate  325 mg daily. Hemoglobin currently normal at 12.6. Continue to monitor closely. -daily CBC -transfuse if hgb under 7  Diet: NPO VTE: Heparin Fluids: LR 125 continuous Code Status: Full Code Family Update: Attempted to call daughter Timmie today. Voicemail left that calling to update her on mothers current stay and if she had any questions. Informed her call was non-emergent. Will try again tomorrow.  Anticipate Discharge: Pending progression of bowel function  Sanjuana Letters DO  Internal Medicine Resident  PGY-1 [place]  Pager: [phone]

## 2020-06-28 NOTE — ED Notes (Addendum)
Attempted in bilateral nares to place NG tube, the pt resisting and pulling away.At this time, the patient would like to hold off on placement of tube. Abigail Houston made aware of the same.

## 2020-06-28 NOTE — Consult Note (Signed)
Abigail Houston 19-Jul-1950  ID:[id number].    Chief Complaint/Reason for Consult: abdominal pain  HPI:  Abigail Houston is a 70 yo female who presented to the ED this evening with abdominal pain. It started a few days ago and was in the epigastric area and across the lower abdomen. She reports one episode of nonbilious nonbloody emesis yesterday. She last had a bowel movement 3 days ago but endorses a history of chronic constipation. She is not passing flatus. Labs were overall unremarkable. CT abd/pelvis showed proximal small bowel dilation with enlarged mesenteric lymph nodes. General surgery was consulted for small bowel obstruction. The patient's only prior abdominal surgery is a C section 38 years ago. Her mother died of ovarian cancer but she has no family history of lymphoma. She denies unintentional weight loss, fevers, chills and night sweats. Her abdominal pain is currently significantly improved.  ROS: Review of Systems  Constitutional: Negative for chills and weight loss.  HENT: Negative for hearing loss.   Eyes: Negative for redness.  Respiratory: Negative for shortness of breath and wheezing.   Gastrointestinal: Positive for abdominal pain, nausea and vomiting.  Neurological: Negative for speech change and focal weakness.    Family History  Problem Relation Age of Onset  . Ovarian cancer Mother   . Diabetes Sister   . Heart disease Brother   . Colon cancer Neg Hx   . Esophageal cancer Neg Hx   . Stomach cancer Neg Hx   . Rectal cancer Neg Hx   . Colon polyps Neg Hx     Past Medical History:  Diagnosis Date  . Arthritis   . Cataract   . Diabetes mellitus without complication ([place])   . Sleep apnea    does not wear CPAP  . Snoring   . Thickened endometrium     Past Surgical History:  Procedure Laterality Date  . arm fracture  1984   left  . CESAREAN SECTION     one time  . EYE SURGERY  07/2017   right eye cataract surgery  . varicose veins  11/2017   left leg     Social History:  reports that she has never smoked. She has never used smokeless tobacco. She reports current alcohol use of about 1.0 standard drink of alcohol per week. She reports that she does not use drugs.  Allergies:  Allergies  Allergen Reactions  . Motrin [Ibuprofen] Hives    hives  . Pravastatin Other (See Comments)    Leg cramps  . Ampicillin Hives  . Penicillins Hives and Swelling    (Not in a hospital admission)    Physical Exam: Blood pressure 129/77, pulse 77, temperature 98.1 F (36.7 C), temperature source Oral, resp. rate 15, SpO2 99 %. General: resting comfortably, appears stated age, no apparent distress Neurological: alert and oriented, no focal deficits, cranial nerves grossly in tact HEENT: normocephalic, atraumatic, oropharynx clear, no scleral icterus CV: regular rate and rhythm, extremities warm and well-perfused Respiratory: normal work of breathing, symmetric chest wall expansion Abdomen: soft, nondistended, nontender to deep palpation. No masses or organomegaly. Extremities: warm and well-perfused, no deformities, moving all extremities spontaneously Psychiatric: normal mood and affect Skin: warm and dry, no jaundice, no rashes or lesions   Results for orders placed or performed during the hospital encounter of 06/27/20 (from the past 48 hour(s))  Lipase, blood     Status: None   Collection Time: 06/27/20  5:55 PM  Result Value Ref Range  Lipase 23 11 - 51 U/L    Comment: Performed at [facility], [place] [address] [place], [place] [address]  Comprehensive metabolic panel     Status: Abnormal   Collection Time: 06/27/20  5:55 PM  Result Value Ref Range   Sodium 140 135 - 145 mmol/L   Potassium 3.7 3.5 - 5.1 mmol/L   Chloride 105 98 - 111 mmol/L   CO2 19 (L) 22 - 32 mmol/L   Glucose, Bld 165 (H) 70 - 99 mg/dL    Comment: Glucose reference range applies only to samples taken after fasting for at least 8 hours.   BUN 8 8 - 23 mg/dL    Creatinine, Ser 0.85 0.44 - 1.00 mg/dL   Calcium 9.7 8.9 - 10.3 mg/dL   Total Protein <3.0 (L) 6.5 - 8.1 g/dL   Albumin 3.9 3.5 - 5.0 g/dL   AST 18 15 - 41 U/L   ALT 14 0 - 44 U/L   Alkaline Phosphatase 64 38 - 126 U/L   Total Bilirubin 0.7 0.3 - 1.2 mg/dL   GFR, Estimated >60 >60 mL/min    Comment: (NOTE) Calculated using the CKD-EPI Creatinine Equation (2021)    Anion gap 16 (H) 5 - 15    Comment: Performed at [facility], [place] [address], [place], [place] [address]  CBC     Status: None   Collection Time: 06/27/20  5:55 PM  Result Value Ref Range   WBC 6.7 4.0 - 10.5 K/uL   RBC 4.41 3.87 - 5.11 MIL/uL   Hemoglobin 12.6 12.0 - 15.0 g/dL   HCT 39.4 36.0 - 46.0 %   MCV 89.3 80.0 - 100.0 fL   MCH 28.6 26.0 - 34.0 pg   MCHC 32.0 30.0 - 36.0 g/dL   RDW 15.2 11.5 - 15.5 %   Platelets 226 150 - 400 K/uL    Comment: REPEATED TO VERIFY   nRBC 0.0 0.0 - 0.2 %    Comment: Performed at [facility], [place] [address], [place], [place] [address]  Urinalysis, Routine w reflex microscopic     Status: Abnormal   Collection Time: 06/27/20  6:16 PM  Result Value Ref Range   Color, Urine AMBER (A) YELLOW    Comment: BIOCHEMICALS MAY BE AFFECTED BY COLOR   APPearance CLOUDY (A) CLEAR   Specific Gravity, Urine 1.028 1.005 - 1.030   pH 5.0 5.0 - 8.0   Glucose, UA NEGATIVE NEGATIVE mg/dL   Hgb urine dipstick NEGATIVE NEGATIVE   Bilirubin Urine NEGATIVE NEGATIVE   Ketones, ur 20 (A) NEGATIVE mg/dL   Protein, ur 100 (A) NEGATIVE mg/dL   Nitrite NEGATIVE NEGATIVE   Leukocytes,Ua SMALL (A) NEGATIVE   RBC / HPF 11-20 0 - 5 RBC/hpf   WBC, UA 11-20 0 - 5 WBC/hpf   Bacteria, UA FEW (A) NONE SEEN   Squamous Epithelial / LPF 0-5 0 - 5   Mucus PRESENT    Budding Yeast PRESENT    Hyaline Casts, UA PRESENT    Ca Oxalate Crys, UA PRESENT     Comment: Performed at [facility], [place] [address] [place] [address]  CBC     Status: Abnormal   Collection Time: 06/28/20  3:16  AM  Result Value Ref Range   WBC 6.2 4.0 - 10.5 K/uL   RBC 4.22 3.87 - 5.11 MIL/uL   Hemoglobin 11.8 (L) 12.0 - 15.0 g/dL   HCT 38.4 36.0 -  46.0 %   MCV 91.0 80.0 - 100.0 fL   MCH 28.0 26.0 - 34.0 pg   MCHC 30.7 30.0 - 36.0 g/dL   RDW 15.3 11.5 - 15.5 %   Platelets 197 150 - 400 K/uL   nRBC 0.0 0.0 - 0.2 %    Comment: Performed at [facility], [place] [address] [place], [place] [address]   CT ABDOMEN PELVIS W CONTRAST  Result Date: 06/28/2020 CLINICAL DATA:  Epigastric pain and suprapubic pain. EXAM: CT ABDOMEN AND PELVIS WITH CONTRAST TECHNIQUE: Multidetector CT imaging of the abdomen and pelvis was performed using the standard protocol following bolus administration of intravenous contrast. CONTRAST:  157mL OMNIPAQUE IOHEXOL 300 MG/ML  SOLN COMPARISON:  None. FINDINGS: Lower chest: Lung bases are clear. Hepatobiliary: No focal liver abnormality is seen. No gallstones, gallbladder wall thickening, or biliary dilatation. Pancreas: Unremarkable. No pancreatic ductal dilatation or surrounding inflammatory changes. Spleen: Normal in size without focal abnormality. Adrenals/Urinary Tract: Adrenal glands are unremarkable. Kidneys are normal, without renal calculi, focal lesion, or hydronephrosis. Bladder is unremarkable. Stomach/Bowel: Stomach is unremarkable. Dilated fluid-filled small bowel with mildly thickened wall. Decompressed terminal ileum with transition zone in the left abdomen. Mild mesenteric edema. Changes likely represent small bowel obstruction. Enteritis may also be present. Colon is decompressed with scattered colonic diverticula. No evidence of diverticulitis. Appendix is normal. Vascular/Lymphatic: Prominent mesenteric lymphadenopathy with anterior pericaval lymphadenopathy. Pericaval node measures 2.8 cm short axis dimension. Mesenteric nodes measure up to 3.1 cm short axis dimension. Appearances are suspicious for lymphoma. Metastasis less likely. Reproductive: Endometrial fluid  and thickening out of proportion to the patient's age. Suggestion of a right adnexal mass. Free fluid in the pelvis. Pelvic ultrasound is suggested for further characterization. Other: No free air.  Abdominal wall musculature appears intact. Musculoskeletal: No acute or significant osseous findings. IMPRESSION: 1. Small bowel obstruction with wall thickening and mesenteric edema, possibly also enteritis. 2. Prominent mesenteric and anterior pericaval lymphadenopathy. Appearances are suspicious for lymphoma. Metastasis less likely. 3. Endometrial fluid and thickening out of proportion to the patient's age. Right adnexal mass. Pelvic ultrasound is suggested for further characterization. 4. Free fluid in the pelvis. Electronically Signed   By: Lucienne Capers M.D.   On: 06/28/2020 01:13      Assessment/Plan 70 yo female presenting with epigastric abdominal pain and concerns for SBO with mesenteric adenopathy. I personally reviewed the patient's CT scan. The stomach is nondistended, but the proximal small bowel is mildly dilated with distal decompressed bowel. There are some enlarged lymph nodes in the root of the mesentery. No pneumatosis or signs of bowel ischemia. It is possible that the adenopathy is reactive. Since patient's small bowel is only mildly dilated and stomach is decompressed, ok to hold off on NG placement at this time unless she develops nausea or vomiting. Patient should be NPO with IV fluid hydration. I discussed with the patient that I think her obstruction is likely adhesive, which commonly resolves with nonoperative management. Surgery will follow.   Michaelle Birks, MD [facility] General, Hepatobiliary and Pancreatic Surgery 06/28/20 3:45 AM

## 2020-06-28 NOTE — ED Notes (Signed)
This RN attempted to call report x2 to 6N RN. This RN provided work phone extension for call to be returned by [employer] RN/Charge nurse

## 2020-06-28 NOTE — ED Notes (Signed)
Pt was initial COVID + on 12/28. Outside of window for isolation. Provider made aware of current testing.

## 2020-06-28 NOTE — ED Notes (Signed)
This RN attempted to call report to [facility] RN. Told that she is in a room & to call back in 5 minutes

## 2020-06-28 NOTE — ED Notes (Signed)
admit Provider at bedside. 

## 2020-06-29 ENCOUNTER — Inpatient Hospital Stay (HOSPITAL_COMMUNITY): Payer: Medicare Other | Admitting: Certified Registered"

## 2020-06-29 ENCOUNTER — Encounter (HOSPITAL_COMMUNITY)
Admission: EM | Disposition: A | Discharge status: Home Health | Payer: Self-pay | Source: Home / Self Care | Attending: Internal Medicine

## 2020-06-29 ENCOUNTER — Encounter (HOSPITAL_COMMUNITY): Payer: Self-pay | Admitting: Internal Medicine

## 2020-06-29 ENCOUNTER — Other Ambulatory Visit: Payer: Self-pay

## 2020-06-29 DIAGNOSIS — D509 Iron deficiency anemia, unspecified: Secondary | ICD-10-CM

## 2020-06-29 DIAGNOSIS — E119 Type 2 diabetes mellitus without complications: Secondary | ICD-10-CM

## 2020-06-29 DIAGNOSIS — K56609 Unspecified intestinal obstruction, unspecified as to partial versus complete obstruction: Principal | ICD-10-CM

## 2020-06-29 HISTORY — PX: LAPAROSCOPY: SHX197

## 2020-06-29 HISTORY — PX: LAPAROTOMY: SHX154

## 2020-06-29 LAB — POCT I-STAT, CHEM 8
BUN: 3 mg/dL — ABNORMAL LOW (ref 8–23)
Calcium, Ion: 1.21 mmol/L (ref 1.15–1.40)
Chloride: 105 mmol/L (ref 98–111)
Creatinine, Ser: 0.5 mg/dL (ref 0.44–1.00)
Glucose, Bld: 85 mg/dL (ref 70–99)
HCT: 34 % — ABNORMAL LOW (ref 36.0–46.0)
Hemoglobin: 11.6 g/dL — ABNORMAL LOW (ref 12.0–15.0)
Potassium: 4.2 mmol/L (ref 3.5–5.1)
Sodium: 141 mmol/L (ref 135–145)
TCO2: 23 mmol/L (ref 22–32)

## 2020-06-29 LAB — CBC
HCT: 33.9 % — ABNORMAL LOW (ref 36.0–46.0)
Hemoglobin: 11 g/dL — ABNORMAL LOW (ref 12.0–15.0)
MCH: 28.8 pg (ref 26.0–34.0)
MCHC: 32.4 g/dL (ref 30.0–36.0)
MCV: 88.7 fL (ref 80.0–100.0)
Platelets: 180 10*3/uL (ref 150–400)
RBC: 3.82 MIL/uL — ABNORMAL LOW (ref 3.87–5.11)
RDW: 15.1 % (ref 11.5–15.5)
WBC: 5.2 10*3/uL (ref 4.0–10.5)
nRBC: 0 % (ref 0.0–0.2)

## 2020-06-29 LAB — BASIC METABOLIC PANEL
Anion gap: 10 (ref 5–15)
BUN: <5 mg/dL — ABNORMAL LOW (ref 8–23)
CO2: 24 mmol/L (ref 22–32)
Calcium: 8.7 mg/dL — ABNORMAL LOW (ref 8.9–10.3)
Chloride: 105 mmol/L (ref 98–111)
Creatinine, Ser: 0.66 mg/dL (ref 0.44–1.00)
GFR, Estimated: >60 mL/min (ref >60)
Glucose, Bld: 97 mg/dL (ref 70–99)
Potassium: 3.3 mmol/L — ABNORMAL LOW (ref 3.5–5.1)
Sodium: 139 mmol/L (ref 135–145)

## 2020-06-29 LAB — GLUCOSE, CAPILLARY
Glucose-Capillary: 63 mg/dL — ABNORMAL LOW (ref 70–99)
Glucose-Capillary: 77 mg/dL (ref 70–99)
Glucose-Capillary: 86 mg/dL (ref 70–99)
Glucose-Capillary: 97 mg/dL (ref 70–99)
Glucose-Capillary: 98 mg/dL (ref 70–99)

## 2020-06-29 SURGERY — LAPAROSCOPY, DIAGNOSTIC
Anesthesia: General

## 2020-06-29 MED ORDER — DEXAMETHASONE SODIUM PHOSPHATE 10 MG/ML IJ SOLN
INTRAMUSCULAR | Status: AC
  Filled 2020-06-29: qty 1

## 2020-06-29 MED ORDER — MIDAZOLAM HCL 2 MG/2ML IJ SOLN
INTRAMUSCULAR | Status: DC | PRN
  Administered 2020-06-29: 2 mg via INTRAVENOUS

## 2020-06-29 MED ORDER — ACETAMINOPHEN 10 MG/ML IV SOLN
INTRAVENOUS | Status: AC
  Filled 2020-06-29: qty 100

## 2020-06-29 MED ORDER — SUGAMMADEX SODIUM 200 MG/2ML IV SOLN
INTRAVENOUS | Status: DC | PRN
  Administered 2020-06-29: 200 mg via INTRAVENOUS

## 2020-06-29 MED ORDER — DEXAMETHASONE SODIUM PHOSPHATE 10 MG/ML IJ SOLN
INTRAMUSCULAR | Status: DC | PRN
  Administered 2020-06-29: 10 mg via INTRAVENOUS

## 2020-06-29 MED ORDER — LABETALOL HCL 5 MG/ML IV SOLN
10.0000 mg | Freq: Once | INTRAVENOUS | Status: AC
  Administered 2020-06-29: 10 mg via INTRAVENOUS

## 2020-06-29 MED ORDER — LIDOCAINE 2% (20 MG/ML) 5 ML SYRINGE
INTRAMUSCULAR | Status: AC
  Filled 2020-06-29: qty 5

## 2020-06-29 MED ORDER — LABETALOL HCL 5 MG/ML IV SOLN
INTRAVENOUS | Status: AC
  Filled 2020-06-29: qty 4

## 2020-06-29 MED ORDER — HYDROMORPHONE HCL 1 MG/ML IJ SOLN
0.2500 mg | INTRAMUSCULAR | Status: DC | PRN
  Administered 2020-06-29 (×2): 0.5 mg via INTRAVENOUS

## 2020-06-29 MED ORDER — ONDANSETRON HCL 4 MG/2ML IJ SOLN
INTRAMUSCULAR | Status: DC | PRN
  Administered 2020-06-29: 4 mg via INTRAVENOUS

## 2020-06-29 MED ORDER — BUPIVACAINE-EPINEPHRINE (PF) 0.25% -1:200000 IJ SOLN
INTRAMUSCULAR | Status: AC
  Filled 2020-06-29: qty 20

## 2020-06-29 MED ORDER — EPHEDRINE 5 MG/ML INJ
INTRAVENOUS | Status: AC
  Filled 2020-06-29: qty 10

## 2020-06-29 MED ORDER — LACTATED RINGERS IV SOLN: INTRAVENOUS | Status: DC | PRN

## 2020-06-29 MED ORDER — PHENYLEPHRINE 40 MCG/ML (10ML) SYRINGE FOR IV PUSH (FOR BLOOD PRESSURE SUPPORT)
PREFILLED_SYRINGE | INTRAVENOUS | Status: DC | PRN
  Administered 2020-06-29: 160 ug via INTRAVENOUS
  Administered 2020-06-29: 80 ug via INTRAVENOUS

## 2020-06-29 MED ORDER — PHENYLEPHRINE HCL-NACL 10-0.9 MG/250ML-% IV SOLN
INTRAVENOUS | Status: DC | PRN
  Administered 2020-06-29: 20 ug/min via INTRAVENOUS

## 2020-06-29 MED ORDER — POTASSIUM CHLORIDE 10 MEQ/100ML IV SOLN
10.0000 meq | INTRAVENOUS | Status: DC
  Administered 2020-06-29 (×3): 10 meq via INTRAVENOUS
  Filled 2020-06-29 (×4): qty 100

## 2020-06-29 MED ORDER — ROCURONIUM BROMIDE 10 MG/ML (PF) SYRINGE
PREFILLED_SYRINGE | INTRAVENOUS | Status: DC | PRN
  Administered 2020-06-29: 50 mg via INTRAVENOUS

## 2020-06-29 MED ORDER — FENTANYL CITRATE (PF) 250 MCG/5ML IJ SOLN
INTRAMUSCULAR | Status: DC | PRN
  Administered 2020-06-29: 100 ug via INTRAVENOUS
  Administered 2020-06-29: 50 ug via INTRAVENOUS

## 2020-06-29 MED ORDER — LIDOCAINE HCL 1 % IJ SOLN
INTRAMUSCULAR | Status: DC | PRN
  Administered 2020-06-29: 5 mL via INTRAMUSCULAR

## 2020-06-29 MED ORDER — POTASSIUM CHLORIDE 10 MEQ/100ML IV SOLN
10.0000 meq | Freq: Once | INTRAVENOUS | Status: AC
  Administered 2020-06-29: 10 meq via INTRAVENOUS

## 2020-06-29 MED ORDER — 0.9 % SODIUM CHLORIDE (POUR BTL) OPTIME
TOPICAL | Status: DC | PRN
  Administered 2020-06-29: 1000 mL

## 2020-06-29 MED ORDER — SUCCINYLCHOLINE CHLORIDE 200 MG/10ML IV SOSY
PREFILLED_SYRINGE | INTRAVENOUS | Status: DC | PRN
  Administered 2020-06-29: 120 mg via INTRAVENOUS

## 2020-06-29 MED ORDER — PROPOFOL 10 MG/ML IV BOLUS
INTRAVENOUS | Status: AC
  Filled 2020-06-29: qty 20

## 2020-06-29 MED ORDER — HYDROMORPHONE HCL 1 MG/ML IJ SOLN
INTRAMUSCULAR | Status: AC
  Filled 2020-06-29: qty 1

## 2020-06-29 MED ORDER — LIDOCAINE 2% (20 MG/ML) 5 ML SYRINGE
INTRAMUSCULAR | Status: DC | PRN
  Administered 2020-06-29: 100 mg via INTRAVENOUS

## 2020-06-29 MED ORDER — ALBUMIN HUMAN 5 % IV SOLN: INTRAVENOUS | Status: DC | PRN

## 2020-06-29 MED ORDER — ROCURONIUM BROMIDE 10 MG/ML (PF) SYRINGE
PREFILLED_SYRINGE | INTRAVENOUS | Status: AC
  Filled 2020-06-29: qty 10

## 2020-06-29 MED ORDER — PROPOFOL 10 MG/ML IV BOLUS
INTRAVENOUS | Status: DC | PRN
  Administered 2020-06-29: 170 mg via INTRAVENOUS
  Administered 2020-06-29: 30 mg via INTRAVENOUS

## 2020-06-29 MED ORDER — SODIUM CHLORIDE 0.9 % IV SOLN
1.0000 g | INTRAVENOUS | Status: AC
  Administered 2020-06-29: 1 g via INTRAVENOUS
  Filled 2020-06-29 (×2): qty 1

## 2020-06-29 MED ORDER — MEPERIDINE HCL 25 MG/ML IJ SOLN
6.2500 mg | INTRAMUSCULAR | Status: DC | PRN
Start: 2020-06-29 — End: 2020-06-29

## 2020-06-29 MED ORDER — ONDANSETRON HCL 4 MG/2ML IJ SOLN
INTRAMUSCULAR | Status: AC
  Filled 2020-06-29: qty 2

## 2020-06-29 MED ORDER — POTASSIUM CHLORIDE CRYS ER 20 MEQ PO TBCR
40.0000 meq | EXTENDED_RELEASE_TABLET | Freq: Two times a day (BID) | ORAL | Status: DC
  Administered 2020-06-29 (×2): 40 meq via ORAL
  Filled 2020-06-29 (×3): qty 2

## 2020-06-29 MED ORDER — SUCCINYLCHOLINE CHLORIDE 200 MG/10ML IV SOSY
PREFILLED_SYRINGE | INTRAVENOUS | Status: AC
  Filled 2020-06-29: qty 10

## 2020-06-29 MED ORDER — PROMETHAZINE HCL 25 MG/ML IJ SOLN
6.2500 mg | INTRAMUSCULAR | Status: DC | PRN
Start: 2020-06-29 — End: 2020-06-29

## 2020-06-29 MED ORDER — MIDAZOLAM HCL 2 MG/2ML IJ SOLN
INTRAMUSCULAR | Status: AC
  Filled 2020-06-29: qty 2

## 2020-06-29 MED ORDER — ACETAMINOPHEN 10 MG/ML IV SOLN
INTRAVENOUS | Status: DC | PRN
  Administered 2020-06-29: 1000 mg via INTRAVENOUS

## 2020-06-29 MED ORDER — PHENYLEPHRINE 40 MCG/ML (10ML) SYRINGE FOR IV PUSH (FOR BLOOD PRESSURE SUPPORT)
PREFILLED_SYRINGE | INTRAVENOUS | Status: AC
  Filled 2020-06-29: qty 10

## 2020-06-29 MED ORDER — LIDOCAINE HCL 1 % IJ SOLN
INTRAMUSCULAR | Status: AC
  Filled 2020-06-29: qty 20

## 2020-06-29 MED ORDER — FENTANYL CITRATE (PF) 250 MCG/5ML IJ SOLN
INTRAMUSCULAR | Status: AC
  Filled 2020-06-29: qty 5

## 2020-06-29 SURGICAL SUPPLY — 65 items
BLADE CLIPPER SURG (BLADE) IMPLANT
CANISTER SUCT 3000ML PPV (MISCELLANEOUS) ×2 IMPLANT
CELLS DAT CNTRL 66122 CELL SVR (MISCELLANEOUS) ×1 IMPLANT
CHLORAPREP W/TINT 26 (MISCELLANEOUS) ×2 IMPLANT
COVER SURGICAL LIGHT HANDLE (MISCELLANEOUS) ×2 IMPLANT
COVER WAND RF STERILE (DRAPES) ×1 IMPLANT
DECANTER SPIKE VIAL GLASS SM (MISCELLANEOUS) ×2 IMPLANT
DERMABOND ADVANCED (GAUZE/BANDAGES/DRESSINGS) ×1
DERMABOND ADVANCED .7 DNX12 (GAUZE/BANDAGES/DRESSINGS) ×1 IMPLANT
DRAPE LAPAROSCOPIC ABDOMINAL (DRAPES) ×2 IMPLANT
DRAPE WARM FLUID 44X44 (DRAPES) ×2 IMPLANT
DRSG OPSITE POSTOP 4X10 (GAUZE/BANDAGES/DRESSINGS) IMPLANT
DRSG OPSITE POSTOP 4X12 (GAUZE/BANDAGES/DRESSINGS) ×1 IMPLANT
DRSG OPSITE POSTOP 4X8 (GAUZE/BANDAGES/DRESSINGS) IMPLANT
ELECT BLADE 6.5 EXT (BLADE) ×1 IMPLANT
ELECT CAUTERY BLADE 6.4 (BLADE) ×2 IMPLANT
ELECT REM PT RETURN 9FT ADLT (ELECTROSURGICAL) ×2
ELECTRODE REM PT RTRN 9FT ADLT (ELECTROSURGICAL) ×1 IMPLANT
GLOVE BIO SURGEON STRL SZ 6 (GLOVE) ×2 IMPLANT
GLOVE SURG UNDER LTX SZ6.5 (GLOVE) ×2 IMPLANT
GOWN STRL REUS W/ TWL LRG LVL3 (GOWN DISPOSABLE) ×3 IMPLANT
GOWN STRL REUS W/TWL LRG LVL3 (GOWN DISPOSABLE) ×3
HANDLE SUCTION POOLE (INSTRUMENTS) ×1 IMPLANT
KIT BASIN OR (CUSTOM PROCEDURE TRAY) ×2 IMPLANT
KIT TURNOVER KIT B (KITS) ×2 IMPLANT
LIGASURE IMPACT 36 18CM CVD LR (INSTRUMENTS) IMPLANT
NDL INSUFFLATION 14GA 120MM (NEEDLE) IMPLANT
NEEDLE INSUFFLATION 14GA 120MM (NEEDLE) ×2 IMPLANT
NS IRRIG 1000ML POUR BTL (IV SOLUTION) ×4 IMPLANT
PACK GENERAL/GYN (CUSTOM PROCEDURE TRAY) ×2 IMPLANT
PAD ARMBOARD 7.5X6 YLW CONV (MISCELLANEOUS) ×4 IMPLANT
PENCIL SMOKE EVACUATOR (MISCELLANEOUS) ×2 IMPLANT
RELOAD PROXIMATE 75MM BLUE (ENDOMECHANICALS) ×4 IMPLANT
RELOAD STAPLE 75 3.8 BLU REG (ENDOMECHANICALS) IMPLANT
RETRACTOR WND ALEXIS 18 MED (MISCELLANEOUS) IMPLANT
RTRCTR WOUND ALEXIS 18CM MED (MISCELLANEOUS) ×2
SCISSORS LAP 5X35 DISP (ENDOMECHANICALS) IMPLANT
SET IRRIG TUBING LAPAROSCOPIC (IRRIGATION / IRRIGATOR) IMPLANT
SET TUBE SMOKE EVAC HIGH FLOW (TUBING) ×2 IMPLANT
SLEEVE ENDOPATH XCEL 5M (ENDOMECHANICALS) ×3 IMPLANT
SPECIMEN JAR LARGE (MISCELLANEOUS) IMPLANT
SPONGE LAP 18X18 RF (DISPOSABLE) IMPLANT
STAPLER GUN LINEAR PROX 60 (STAPLE) ×1 IMPLANT
STAPLER PROXIMATE 75MM BLUE (STAPLE) ×1 IMPLANT
STAPLER VISISTAT 35W (STAPLE) ×1 IMPLANT
SUCTION POOLE HANDLE (INSTRUMENTS) ×4
SUT MNCRL AB 4-0 PS2 18 (SUTURE) ×2 IMPLANT
SUT PDS AB 1 TP1 96 (SUTURE) ×3 IMPLANT
SUT SILK 2 0 SH CR/8 (SUTURE) ×2 IMPLANT
SUT SILK 2 0 TIES 10X30 (SUTURE) ×2 IMPLANT
SUT SILK 3 0 SH CR/8 (SUTURE) ×1 IMPLANT
SUT SILK 3 0 TIES 10X30 (SUTURE) ×2 IMPLANT
SUT SILK 3 0SH CR/8 30 (SUTURE) ×1 IMPLANT
SUT VIC AB 3-0 SH 18 (SUTURE) IMPLANT
SYR CONTROL 10ML LL (SYRINGE) ×1 IMPLANT
TOWEL GREEN STERILE (TOWEL DISPOSABLE) ×2 IMPLANT
TOWEL GREEN STERILE FF (TOWEL DISPOSABLE) ×2 IMPLANT
TRAY FOLEY MTR SLVR 16FR STAT (SET/KITS/TRAYS/PACK) ×1 IMPLANT
TRAY LAPAROSCOPIC MC (CUSTOM PROCEDURE TRAY) ×2 IMPLANT
TROCAR XCEL BLUNT TIP 100MML (ENDOMECHANICALS) IMPLANT
TROCAR XCEL NON-BLD 11X100MML (ENDOMECHANICALS) IMPLANT
TROCAR XCEL NON-BLD 5MMX100MML (ENDOMECHANICALS) ×2 IMPLANT
TUBE CONNECTING 20X1/4 (TUBING) ×1 IMPLANT
TUBING LAP HI FLOW INSUFFLATIO (TUBING) ×1 IMPLANT
YANKAUER SUCT BULB TIP NO VENT (SUCTIONS) ×1 IMPLANT

## 2020-06-29 NOTE — Interval H&P Note (Signed)
History and Physical Interval Note:  06/29/2020 2:32 PM  Abigail Houston  has presented today for surgery, with the diagnosis of SMALL BOWEL OBSTRUCTION.  The various methods of treatment have been discussed with the patient and family. After consideration of risks, benefits and other options for treatment, the patient has consented to  Procedure(s): LAPAROSCOPY DIAGNOSTIC POSSIBLE BOWEL RESECTION (N/A) EXPLORATORY LAPAROTOMY (N/A) as a surgical intervention.  The patient's history has been reviewed, patient examined, no change in status, stable for surgery.  I have reviewed the patient's chart and labs.  Questions were answered to the patient's satisfaction.     [name] Rich Brave

## 2020-06-29 NOTE — Progress Notes (Signed)
Subjective:   No acute events overnight.  Spoke with surgeon, Dr. Kae Heller, outside patient's room prior to seeing the patient. Surgeon recommends ex lap.  During evaluation this morning, the patient states she is "doing good." Daughter Abigail Houston on the phone. Reports she started passing gas. Has been checking blood sugars and reports they are good, denies hypoglycemia. Patient asks what is meant by "major surgery" and it was explained to her that there are many types of major surgeries and this is a commonly performed procedure. Daughter and patient discussing options and believe that going through with diagnostic lap would possibly prevent patient continuing to have n/v and potentially from returning to the ED. Discussed with patients, and questions answered.   Objective:  Vital signs in last 24 hours: Vitals:   06/28/20 1715 06/28/20 2130 06/29/20 0143 06/29/20 0408  BP: 138/79 132/80 134/79 110/64  Pulse: 68 69 70 69  Resp: 16 18 16 17   Temp: 98.6 F (37 C) 98.3 F (36.8 C) 98.4 F (36.9 C) 98.6 F (37 C)  TempSrc: Oral Oral Oral Oral  SpO2: 100% 100% 100% 98%  Weight:      Height:       Constitutional: well-appearing, resting comfortably in bed.  Cardiovascular: regular rate and rhythm Pulmonary/Chest: normal work of breathing on room air Abdominal: distended MSK: normal bulk and tone Neurological: alert & oriented x 3 Skin: warm and dry Psych: Calm mood   CBC Latest Ref Rng & Units 06/29/2020 06/28/2020 06/27/2020  WBC 4.0 - 10.5 K/uL 5.2 6.2 6.7  Hemoglobin 12.0 - 15.0 g/dL 11.0(L) 11.8(L) 12.6  Hematocrit 36.0 - 46.0 % 33.9(L) 38.4 39.4  Platelets 150 - 400 K/uL 180 197 226   BMP Latest Ref Rng & Units 06/29/2020 06/28/2020 06/27/2020  Glucose 70 - 99 mg/dL 97 125(H) 165(H)  BUN 8 - 23 mg/dL <5(L) 8 8  Creatinine 0.44 - 1.00 mg/dL 0.66 0.82 0.85  Sodium 135 - 145 mmol/L 139 137 140  Potassium 3.5 - 5.1 mmol/L 3.3(L) 3.4(L) 3.7  Chloride 98 - 111 mmol/L 105 103 105  CO2  22 - 32 mmol/L 24 23 19(L)  Calcium 8.9 - 10.3 mg/dL 8.7(L) 9.1 9.7     Assessment/Plan:  [name] is a 70 yo female with PMH of arthritis, IDA, T2DM, and sleep apnea who present presented for evaluation of worsening abdominal pain, nausea and vomiting and found to have small bowel obstruction.   #Small bowel obstruction 2/2 Right adnexal mass Patient denies nausea and vomiting and has starting passing flatus. Transvaginal US performed yesterday, 6.9 cm soft tissue mass right adnexal mass around area of small bowel obstruction. General surgery offered patient for diagnostic lap vs follow up outpatient. From family conversation with patient, it sounds as though they would like to follow up with procedure while admitted. Will await confirmation and further evaluation by general surgery.   -continue to monitor for flatus and passing bowels -NG tube if begins having active vomiting -remain NPO in anticipation for procedure -IV LR at 75 mL/hr -IV Dilaudid 0.5-1 mg q2h prn for pain -Zofran 4 mg q4h prn for nausea -surgery following -daily BMP -transvaginal US report forwarded to patient's PCP and OBGYN   #T2DM --CBG monitoring q8h in setting of NPO status --D5 if patient becomes hypoglycemic     #OSA Stable. On chart review, patient supposed to be on CPAP at night. Patient denies shortness of breath or cough. Satting well on RA. If becomes hypoxic in the  evening, consider ordering CPAP.   #Iron deficiency anemia Patient has a history of iron deficiency anemia on supplementation with ferrous sulfate 325 mg daily. Hemoglobin currently normal at 12.6. Continue to monitor closely. -daily CBC -transfuse if hgb under 7  Diet: NPO VTE: Heparin Fluids: LR 125 continuous Code Status: Full Code Family Update: Family updated while in room  Anticipate Discharge: Home pending possible procedure by general surgery   [place]  Internal Medicine Resident PGY-1 [place]  Pager: [phone]

## 2020-06-29 NOTE — Transfer of Care (Signed)
Immediate Anesthesia Transfer of Care Note  Patient: Abigail Houston  Procedure(s) Performed: LAPAROSCOPY DIAGNOSTIC, SMALL  BOWEL RESECTION (N/A ) EXPLORATORY LAPAROTOMY (N/A )  Patient Location: PACU  Anesthesia Type:General  Level of Consciousness: drowsy  Airway & Oxygen Therapy: Patient Spontanous Breathing and Patient connected to face mask oxygen  Post-op Assessment: Report given to RN and Post -op Vital signs reviewed and stable  Post vital signs: Reviewed and stable  Last Vitals:  Vitals Value Taken Time  BP 162/91 06/29/20 1653  Temp    Pulse 80 06/29/20 1655  Resp 22 06/29/20 1655  SpO2 96 % 06/29/20 1655  Vitals shown include unvalidated device data.  Last Pain:  Vitals:   06/29/20 1351  TempSrc: Oral  PainSc:          Complications: No complications documented.

## 2020-06-29 NOTE — Anesthesia Preprocedure Evaluation (Addendum)
Anesthesia Evaluation  Patient identified by MRN, date of birth, ID band Patient awake    Reviewed: Allergy & Precautions, NPO status , Patient's Chart, lab work & pertinent test results  Airway Mallampati: II  TM Distance: >3 FB Neck ROM: Full    Dental  (+) Dental Advisory Given, Caps Upper permanent bridge :   Pulmonary sleep apnea ,    Pulmonary exam normal breath sounds clear to auscultation       Cardiovascular negative cardio ROS   Rhythm:Regular Rate:Normal     Neuro/Psych  Neuromuscular disease    GI/Hepatic negative GI ROS, Neg liver ROS,   Endo/Other  diabetes  Renal/GU negative Renal ROS     Musculoskeletal  (+) Arthritis ,   Abdominal   Peds  Hematology negative hematology ROS (+)   Anesthesia Other Findings   Reproductive/Obstetrics                            Anesthesia Physical Anesthesia Plan  ASA: III and emergent  Anesthesia Plan: General   Post-op Pain Management:    Induction: Intravenous, Rapid sequence and Cricoid pressure planned  PONV Risk Score and Plan: 4 or greater and Ondansetron, Treatment may vary due to age or medical condition and Dexamethasone  Airway Management Planned: Oral ETT  Additional Equipment: None  Intra-op Plan:   Post-operative Plan: Extubation in OR  Informed Consent: I have reviewed the patients History and Physical, chart, labs and discussed the procedure including the risks, benefits and alternatives for the proposed anesthesia with the patient or authorized representative who has indicated his/her understanding and acceptance.     Dental advisory given  Plan Discussed with: CRNA  Anesthesia Plan Comments:        Anesthesia Quick Evaluation

## 2020-06-29 NOTE — Progress Notes (Signed)
RT set up CPAP and placed on patient. Pt tolerating settings well at this time. RT will monitor as needed. °

## 2020-06-29 NOTE — H&P (View-Only) (Signed)
Subjective/Chief Complaint: Feeling somewhat better this morning.    Objective: Vital signs in last 24 hours: Temp:  [98.3 F (36.8 C)-98.6 F (37 C)] 98.6 F (37 C) (01/24 0408) Pulse Rate:  [66-78] 69 (01/24 0408) Resp:  [14-18] 17 (01/24 0408) BP: (110-138)/(64-95) 110/64 (01/24 0408) SpO2:  [98 %-100 %] 98 % (01/24 0408) Weight:  [89.8 kg] 89.8 kg (01/23 1028) Last BM Date: 06/25/20  Intake/Output from previous day: 01/23 0701 - 01/24 0700 In: 1856.3 [I.V.:1804.4; IV Piggyback:51.9] Out: -  Intake/Output this shift: No intake/output data recorded.  General appearance: alert and cooperative Resp: unlabored Cardio: regular rate and rhythm GI: soft, non-tender; bowel sounds normal; no masses,  no organomegaly Extremities: extremities normal, atraumatic, no cyanosis or edema Neurologic: Grossly normal  Lab Results:  Recent Labs    06/28/20 0316 06/29/20 0233  WBC 6.2 5.2  HGB 11.8* 11.0*  HCT 38.4 33.9*  PLT 197 180   BMET Recent Labs    06/28/20 0316 06/29/20 0233  NA 137 139  K 3.4* 3.3*  CL 103 105  CO2 23 24  GLUCOSE 125* 97  BUN 8 <5*  CREATININE 0.82 0.66  CALCIUM 9.1 8.7*   PT/INR No results for input(s): LABPROT, INR in the last 72 hours. ABG No results for input(s): PHART, HCO3 in the last 72 hours.  Invalid input(s): PCO2, PO2  Studies/Results: CT ABDOMEN PELVIS W CONTRAST  Result Date: 06/28/2020 CLINICAL DATA:  Epigastric pain and suprapubic pain. EXAM: CT ABDOMEN AND PELVIS WITH CONTRAST TECHNIQUE: Multidetector CT imaging of the abdomen and pelvis was performed using the standard protocol following bolus administration of intravenous contrast. CONTRAST:  144mL OMNIPAQUE IOHEXOL 300 MG/ML  SOLN COMPARISON:  None. FINDINGS: Lower chest: Lung bases are clear. Hepatobiliary: No focal liver abnormality is seen. No gallstones, gallbladder wall thickening, or biliary dilatation. Pancreas: Unremarkable. No pancreatic ductal dilatation or  surrounding inflammatory changes. Spleen: Normal in size without focal abnormality. Adrenals/Urinary Tract: Adrenal glands are unremarkable. Kidneys are normal, without renal calculi, focal lesion, or hydronephrosis. Bladder is unremarkable. Stomach/Bowel: Stomach is unremarkable. Dilated fluid-filled small bowel with mildly thickened wall. Decompressed terminal ileum with transition zone in the left abdomen. Mild mesenteric edema. Changes likely represent small bowel obstruction. Enteritis may also be present. Colon is decompressed with scattered colonic diverticula. No evidence of diverticulitis. Appendix is normal. Vascular/Lymphatic: Prominent mesenteric lymphadenopathy with anterior pericaval lymphadenopathy. Pericaval node measures 2.8 cm short axis dimension. Mesenteric nodes measure up to 3.1 cm short axis dimension. Appearances are suspicious for lymphoma. Metastasis less likely. Reproductive: Endometrial fluid and thickening out of proportion to the patient's age. Suggestion of a right adnexal mass. Free fluid in the pelvis. Pelvic ultrasound is suggested for further characterization. Other: No free air.  Abdominal wall musculature appears intact. Musculoskeletal: No acute or significant osseous findings. IMPRESSION: 1. Small bowel obstruction with wall thickening and mesenteric edema, possibly also enteritis. 2. Prominent mesenteric and anterior pericaval lymphadenopathy. Appearances are suspicious for lymphoma. Metastasis less likely. 3. Endometrial fluid and thickening out of proportion to the patient's age. Right adnexal mass. Pelvic ultrasound is suggested for further characterization. 4. Free fluid in the pelvis. Electronically Signed   By: Lucienne Capers M.D.   On: 06/28/2020 01:13   US PELVIC COMPLETE WITH TRANSVAGINAL  Result Date: 06/28/2020 CLINICAL DATA:  Patient with recent CT and possible pelvic mass. EXAM: TRANSABDOMINAL AND TRANSVAGINAL ULTRASOUND OF PELVIS TECHNIQUE: Both  transabdominal and transvaginal ultrasound examinations of the pelvis were performed. Transabdominal technique  was performed for global imaging of the pelvis including uterus, ovaries, adnexal regions, and pelvic cul-de-sac. It was necessary to proceed with endovaginal exam following the transabdominal exam to visualize the adnexal structures. COMPARISON:  CT abdomen pelvis 11/26/2020 FINDINGS: Uterus Measurements: 5.3 x 2.8 x 4.3 cm = volume: 33.5 mL. No fibroids or other mass visualized. Endometrium Thickness: 9 mm. The endometrium is thickened. There is fluid and heterogeneous soft tissue within the endometrial canal. Cannot exclude endometrial mass. Right ovary No definite right ovarian tissue is visualized. Left ovary Not visualized. Other findings Within the right adnexa there is a 6.9 x 2.9 x 5.7 cm heterogeneous soft tissue mass. This conglomerate mass appears to be a soft tissue mass involving a small bowel loop within the right hemipelvis. There is a small amount of surrounding fluid. IMPRESSION: 1. There is a 6.9 cm soft tissue mass within the right adnexa which appears to be a soft tissue mass associated with a loop of small bowel within the right lower abdomen. This appears to be located at the point of transition of the small bowel obstruction. Findings are concerning for possible small bowel lymphoma, carcinoid or small bowel adenocarcinoma. 2. The endometrium is thickened with a small amount of fluid and heterogeneous soft tissue. Cannot exclude underlying endometrial mass. Recommend non urgent gyn consultation and endometrial biopsy. 3. These results were called by telephone at the time of interpretation on 06/28/2020 at 10:23 am to provider Dr. Sharon Seller, who verbally acknowledged these results. Electronically Signed   By: Lovey Newcomer M.D.   On: 06/28/2020 10:52    Anti-infectives: Anti-infectives (From admission, onward)   None      Assessment/Plan: Type 2 diabetes Anemia Sleep  apnea  Small bowel obstruction-she has had abdominal pain with intermittent nausea and vomiting for several months actually with acute worsening the brought her to the emergency room 2 days ago.  She reports intermittent pain and tenderness in the lower abdomen and occasionally this migrates to the epigastrium when she becomes nauseated after eating.  She does have a family history of ovarian cancer in her mother and on imaging (CT and transvaginal ultrasound) there is a right adnexal mass and endometrial fluid thickening with some free fluid in the pelvis; CT does show a thickened loop of small bowel in the pelvis with mesenteric edema and prominent mesenteric and pericaval lymphadenopathy.  She has no B symptoms concerning for lymphoma.  On her ultrasound no definite right ovarian tissue is visualized nor is the left ovary, but within the right adnexa is noted a 7 x 3 x 6 cm heterogenous soft tissue mass which involves a small bowel loop in the right hemipelvis.  By ultrasound the differential includes small bowel lymphoma, carcinoid or small bowel adenocarcinoma, no mention of possible ovarian malignancy although I think this is certainly within the differential.  Cannot exclude underlying endometrial mass.  I discussed all these findings with her and her daughter and discussed options going forward.  I think given that her pain is currently resolved we can try p.o. challenge and discharge her for further outpatient work-up however, given the timeline of her symptoms and crescendo, I also think it would be reasonable to proceed with a diagnostic laparoscopy this admission to evaluate the bowel and adnexa.  I discussed with her that if definite bowel pathology is identified I would proceed with a small bowel resection and anastomosis.  We discussed risks of bleeding, infection, pain, scarring, conversion to open surgery, injury to intra-abdominal  or retroperitoneal structures, anastomotic leak or stricture,  need for further operations depending on intra-abdominal findings.  If the bowel in fact appears normal and there is no obstructive or mass pathology and ovarian pathology is identified, I discussed with her that I would most likely terminate the procedure and have her follow-up with her gynecology oncologist for formal resection in the near future.  Questions were welcomed and answered.  They will discuss and if surgery is desired we can proceed as early as today.  Please keep her n.p.o.    LOS: 1 day    Clovis Riley 06/29/2020

## 2020-06-29 NOTE — Progress Notes (Signed)
 Subjective/Chief Complaint: Feeling somewhat better this morning.    Objective: Vital signs in last 24 hours: Temp:  [98.3 F (36.8 C)-98.6 F (37 C)] 98.6 F (37 C) (01/24 0408) Pulse Rate:  [66-78] 69 (01/24 0408) Resp:  [14-18] 17 (01/24 0408) BP: (110-138)/(64-95) 110/64 (01/24 0408) SpO2:  [98 %-100 %] 98 % (01/24 0408) Weight:  [89.8 kg] 89.8 kg (01/23 1028) Last BM Date: 06/25/20  Intake/Output from previous day: 01/23 0701 - 01/24 0700 In: 1856.3 [I.V.:1804.4; IV Piggyback:51.9] Out: -  Intake/Output this shift: No intake/output data recorded.  General appearance: alert and cooperative Resp: unlabored Cardio: regular rate and rhythm GI: soft, non-tender; bowel sounds normal; no masses,  no organomegaly Extremities: extremities normal, atraumatic, no cyanosis or edema Neurologic: Grossly normal  Lab Results:  Recent Labs    06/28/20 0316 06/29/20 0233  WBC 6.2 5.2  HGB 11.8* 11.0*  HCT 38.4 33.9*  PLT 197 180   BMET Recent Labs    06/28/20 0316 06/29/20 0233  NA 137 139  K 3.4* 3.3*  CL 103 105  CO2 23 24  GLUCOSE 125* 97  BUN 8 <5*  CREATININE 0.82 0.66  CALCIUM 9.1 8.7*   PT/INR No results for input(s): LABPROT, INR in the last 72 hours. ABG No results for input(s): PHART, HCO3 in the last 72 hours.  Invalid input(s): PCO2, PO2  Studies/Results: CT ABDOMEN PELVIS W CONTRAST  Result Date: 06/28/2020 CLINICAL DATA:  Epigastric pain and suprapubic pain. EXAM: CT ABDOMEN AND PELVIS WITH CONTRAST TECHNIQUE: Multidetector CT imaging of the abdomen and pelvis was performed using the standard protocol following bolus administration of intravenous contrast. CONTRAST:  100mL OMNIPAQUE IOHEXOL 300 MG/ML  SOLN COMPARISON:  None. FINDINGS: Lower chest: Lung bases are clear. Hepatobiliary: No focal liver abnormality is seen. No gallstones, gallbladder wall thickening, or biliary dilatation. Pancreas: Unremarkable. No pancreatic ductal dilatation or  surrounding inflammatory changes. Spleen: Normal in size without focal abnormality. Adrenals/Urinary Tract: Adrenal glands are unremarkable. Kidneys are normal, without renal calculi, focal lesion, or hydronephrosis. Bladder is unremarkable. Stomach/Bowel: Stomach is unremarkable. Dilated fluid-filled small bowel with mildly thickened wall. Decompressed terminal ileum with transition zone in the left abdomen. Mild mesenteric edema. Changes likely represent small bowel obstruction. Enteritis may also be present. Colon is decompressed with scattered colonic diverticula. No evidence of diverticulitis. Appendix is normal. Vascular/Lymphatic: Prominent mesenteric lymphadenopathy with anterior pericaval lymphadenopathy. Pericaval node measures 2.8 cm short axis dimension. Mesenteric nodes measure up to 3.1 cm short axis dimension. Appearances are suspicious for lymphoma. Metastasis less likely. Reproductive: Endometrial fluid and thickening out of proportion to the patient's age. Suggestion of a right adnexal mass. Free fluid in the pelvis. Pelvic ultrasound is suggested for further characterization. Other: No free air.  Abdominal wall musculature appears intact. Musculoskeletal: No acute or significant osseous findings. IMPRESSION: 1. Small bowel obstruction with wall thickening and mesenteric edema, possibly also enteritis. 2. Prominent mesenteric and anterior pericaval lymphadenopathy. Appearances are suspicious for lymphoma. Metastasis less likely. 3. Endometrial fluid and thickening out of proportion to the patient's age. Right adnexal mass. Pelvic ultrasound is suggested for further characterization. 4. Free fluid in the pelvis. Electronically Signed   By: William  Stevens M.D.   On: 06/28/2020 01:13   US PELVIC COMPLETE WITH TRANSVAGINAL  Result Date: 06/28/2020 CLINICAL DATA:  Patient with recent CT and possible pelvic mass. EXAM: TRANSABDOMINAL AND TRANSVAGINAL ULTRASOUND OF PELVIS TECHNIQUE: Both  transabdominal and transvaginal ultrasound examinations of the pelvis were performed. Transabdominal technique   was performed for global imaging of the pelvis including uterus, ovaries, adnexal regions, and pelvic cul-de-sac. It was necessary to proceed with endovaginal exam following the transabdominal exam to visualize the adnexal structures. COMPARISON:  CT abdomen pelvis 11/26/2020 FINDINGS: Uterus Measurements: 5.3 x 2.8 x 4.3 cm = volume: 33.5 mL. No fibroids or other mass visualized. Endometrium Thickness: 9 mm. The endometrium is thickened. There is fluid and heterogeneous soft tissue within the endometrial canal. Cannot exclude endometrial mass. Right ovary No definite right ovarian tissue is visualized. Left ovary Not visualized. Other findings Within the right adnexa there is a 6.9 x 2.9 x 5.7 cm heterogeneous soft tissue mass. This conglomerate mass appears to be a soft tissue mass involving a small bowel loop within the right hemipelvis. There is a small amount of surrounding fluid. IMPRESSION: 1. There is a 6.9 cm soft tissue mass within the right adnexa which appears to be a soft tissue mass associated with a loop of small bowel within the right lower abdomen. This appears to be located at the point of transition of the small bowel obstruction. Findings are concerning for possible small bowel lymphoma, carcinoid or small bowel adenocarcinoma. 2. The endometrium is thickened with a small amount of fluid and heterogeneous soft tissue. Cannot exclude underlying endometrial mass. Recommend non urgent gyn consultation and endometrial biopsy. 3. These results were called by telephone at the time of interpretation on 06/28/2020 at 10:23 am to provider Dr. Sharon Seller, who verbally acknowledged these results. Electronically Signed   By: Lovey Newcomer M.D.   On: 06/28/2020 10:52    Anti-infectives: Anti-infectives (From admission, onward)   None      Assessment/Plan: Type 2 diabetes Anemia Sleep  apnea  Small bowel obstruction-she has had abdominal pain with intermittent nausea and vomiting for several months actually with acute worsening the brought her to the emergency room 2 days ago.  She reports intermittent pain and tenderness in the lower abdomen and occasionally this migrates to the epigastrium when she becomes nauseated after eating.  She does have a family history of ovarian cancer in her mother and on imaging (CT and transvaginal ultrasound) there is a right adnexal mass and endometrial fluid thickening with some free fluid in the pelvis; CT does show a thickened loop of small bowel in the pelvis with mesenteric edema and prominent mesenteric and pericaval lymphadenopathy.  She has no B symptoms concerning for lymphoma.  On her ultrasound no definite right ovarian tissue is visualized nor is the left ovary, but within the right adnexa is noted a 7 x 3 x 6 cm heterogenous soft tissue mass which involves a small bowel loop in the right hemipelvis.  By ultrasound the differential includes small bowel lymphoma, carcinoid or small bowel adenocarcinoma, no mention of possible ovarian malignancy although I think this is certainly within the differential.  Cannot exclude underlying endometrial mass.  I discussed all these findings with her and her daughter and discussed options going forward.  I think given that her pain is currently resolved we can try p.o. challenge and discharge her for further outpatient work-up however, given the timeline of her symptoms and crescendo, I also think it would be reasonable to proceed with a diagnostic laparoscopy this admission to evaluate the bowel and adnexa.  I discussed with her that if definite bowel pathology is identified I would proceed with a small bowel resection and anastomosis.  We discussed risks of bleeding, infection, pain, scarring, conversion to open surgery, injury to intra-abdominal  or retroperitoneal structures, anastomotic leak or stricture,  need for further operations depending on intra-abdominal findings.  If the bowel in fact appears normal and there is no obstructive or mass pathology and ovarian pathology is identified, I discussed with her that I would most likely terminate the procedure and have her follow-up with her gynecology oncologist for formal resection in the near future.  Questions were welcomed and answered.  They will discuss and if surgery is desired we can proceed as early as today.  Please keep her n.p.o.    LOS: 1 day    Clovis Riley 06/29/2020

## 2020-06-29 NOTE — Progress Notes (Signed)
Confirmed with infection prevention that pt does NOT need to be on Airborne or contact precautions.

## 2020-06-29 NOTE — Op Note (Signed)
Operative Note  Abigail Houston  [id number]  [id number]  06/29/2020   Surgeon: Victorino Sparrow ConnorMD  Assistant: Margie Billet PA-C  Procedure performed: Diagnostic laparoscopy, laparoscopic assisted small bowel resection  Preop diagnosis: Small bowel obstruction Post-op diagnosis/intraop findings: Small bowel mass  Specimens: Small bowel resection Retained items: No EBL: 10 cc Complications: none  Description of procedure: After obtaining informed consent the patient was taken to the operating room and placed supine on operating room table wheregeneral endotracheal anesthesia was initiated, preoperative antibiotics were administered, SCDs applied, and a formal timeout was performed.  The abdomen was prepped and draped in usual sterile fashion.  Peritoneal access was gained using a Veress needle and insufflation to 15 mmHg ensued without issue.  A 5 mm trocar was placed to superior to the umbilicus and gross inspection demonstrated no evidence of injury from our entry.  The proximal small bowel is mildly distended bilateral 5 mm trochars were placed under direct visualization.  The patient was placed in Trendelenburg and the small bowel followed distally.  There was an obvious mass at the transition point.  There were no adhesions.  Both ovaries were able to be visualized and appeared within normal limits.  Small amount of fluid in the pelvis.  The supraumbilical trocar incision was extended and cautery used to dissect the soft tissues until the midline was encountered and this was divided.  The patient's fat-containing umbilical hernia was included in the incision.  An Alexis wound protector was placed and the abnormal segment of small bowel was eviscerated.  Serial fires of the blue load 75 mm Endo GIA were used to resect the abnormal segment with greater than 5 cm margin on either side of the palpable mass.  The intervening mesentery was serially clamped and ligated with 3-0 silk ties.  The  specimen was passed off for pathological examination.  Enterotomies were made on the antimesenteric border of each end of the small bowel and then a blue load 75 mm stapler was used to create a side-to-side functional end-to-end small bowel anastomosis.  The common enterotomy was closed with a TX 60 blue load.  3-0 silks were placed at the apex of the staple line x 2 as well as to imbricate the corners of the common enterotomy closure.  A small amount of bleeding on the staple line was controlled with 3-0 silk sutures.  The mesenteric defect was closed with interrupted 3 oh silks.  On completion the anastomosis is palpably patent, appears well perfused and is under no tension.  This was reduced back into the abdomen and the abdominal cavity was irrigated with warm sterile saline.  The effluent was clear.  The wound protector was removed and the fascia was closed with a running looped #1 PDS.  The abdomen was reinsufflated and the fascial closure inspected.  This was noted to be airtight with no entrapped structures.  The anastomosis is visualized in the lower abdomen and omentum was brought down over the small bowel.  The abdomen was again desufflated and the skin incisions were closed with subcuticular 4-0 Monocryl.  A honeycomb dressing was placed on the midline incision and Dermabond was placed on the laparoscopic port sites.  The patient was then awakened, extubated and taken to PACU in stable condition.   All counts were correct at the completion of the case.

## 2020-06-29 NOTE — Anesthesia Procedure Notes (Signed)
Procedure Name: Intubation Date/Time: 06/29/2020 3:20 PM Performed by: Reece Agar, CRNA Pre-anesthesia Checklist: Patient identified, Emergency Drugs available, Suction available and Patient being monitored Patient Re-evaluated:Patient Re-evaluated prior to induction Oxygen Delivery Method: Circle System Utilized Preoxygenation: Pre-oxygenation with 100% oxygen Induction Type: IV induction Ventilation: Mask ventilation without difficulty Laryngoscope Size: Mac and 3 Grade View: Grade II Tube type: Oral Tube size: 7.0 mm Number of attempts: 1 Airway Equipment and Method: Stylet Placement Confirmation: ETT inserted through vocal cords under direct vision,  positive ETCO2 and breath sounds checked- equal and bilateral Secured at: 22 cm Tube secured with: Tape Dental Injury: Teeth and Oropharynx as per pre-operative assessment

## 2020-06-30 ENCOUNTER — Encounter (HOSPITAL_COMMUNITY): Payer: Self-pay | Admitting: Surgery

## 2020-06-30 DIAGNOSIS — C9150 Adult T-cell lymphoma/leukemia (HTLV-1-associated) not having achieved remission: Secondary | ICD-10-CM

## 2020-06-30 LAB — CBC
HCT: 34.5 % — ABNORMAL LOW (ref 36.0–46.0)
Hemoglobin: 10.7 g/dL — ABNORMAL LOW (ref 12.0–15.0)
MCH: 28.2 pg (ref 26.0–34.0)
MCHC: 31 g/dL (ref 30.0–36.0)
MCV: 90.8 fL (ref 80.0–100.0)
Platelets: 176 10*3/uL (ref 150–400)
RBC: 3.8 MIL/uL — ABNORMAL LOW (ref 3.87–5.11)
RDW: 14.9 % (ref 11.5–15.5)
WBC: 8.5 10*3/uL (ref 4.0–10.5)
nRBC: 0 % (ref 0.0–0.2)

## 2020-06-30 LAB — BASIC METABOLIC PANEL
Anion gap: 11 (ref 5–15)
BUN: 6 mg/dL — ABNORMAL LOW (ref 8–23)
CO2: 21 mmol/L — ABNORMAL LOW (ref 22–32)
Calcium: 8.6 mg/dL — ABNORMAL LOW (ref 8.9–10.3)
Chloride: 105 mmol/L (ref 98–111)
Creatinine, Ser: 0.6 mg/dL (ref 0.44–1.00)
GFR, Estimated: >60 mL/min (ref >60)
Glucose, Bld: 143 mg/dL — ABNORMAL HIGH (ref 70–99)
Potassium: 4.4 mmol/L (ref 3.5–5.1)
Sodium: 137 mmol/L (ref 135–145)

## 2020-06-30 LAB — GLUCOSE, CAPILLARY
Glucose-Capillary: 104 mg/dL — ABNORMAL HIGH (ref 70–99)
Glucose-Capillary: 127 mg/dL — ABNORMAL HIGH (ref 70–99)
Glucose-Capillary: 164 mg/dL — ABNORMAL HIGH (ref 70–99)

## 2020-06-30 MED ORDER — ACETAMINOPHEN 325 MG PO TABS: 650.0000 mg | ORAL_TABLET | Freq: Four times a day (QID) | ORAL | Status: DC | PRN

## 2020-06-30 MED ORDER — ENOXAPARIN SODIUM 40 MG/0.4ML ~~LOC~~ SOLN
40.0000 mg | SUBCUTANEOUS | Status: DC
  Administered 2020-06-30 – 2020-07-04 (×5): 40 mg via SUBCUTANEOUS
  Filled 2020-06-30 (×5): qty 0.4

## 2020-06-30 MED ORDER — METHOCARBAMOL 1000 MG/10ML IJ SOLN
500.0000 mg | Freq: Four times a day (QID) | INTRAVENOUS | Status: DC | PRN
  Administered 2020-06-30: 500 mg via INTRAVENOUS
  Filled 2020-06-30 (×2): qty 5

## 2020-06-30 NOTE — Anesthesia Postprocedure Evaluation (Signed)
Anesthesia Post Note  Patient: Abigail Houston  Procedure(s) Performed: LAPAROSCOPY DIAGNOSTIC, SMALL  BOWEL RESECTION (N/A ) EXPLORATORY LAPAROTOMY (N/A )     Patient location during evaluation: PACU Anesthesia Type: General Level of consciousness: awake and alert Pain management: pain level controlled Vital Signs Assessment: post-procedure vital signs reviewed and stable Respiratory status: spontaneous breathing, nonlabored ventilation, respiratory function stable and patient connected to nasal cannula oxygen Cardiovascular status: blood pressure returned to baseline and stable Postop Assessment: no apparent nausea or vomiting Anesthetic complications: no   No complications documented.  Last Vitals:  Vitals:   06/30/20 0221 06/30/20 0543  BP: 103/66 116/75  Pulse: (!) 58 65  Resp: 18 18  Temp: (!) 36.3 C 36.5 C  SpO2: 96% 98%    Last Pain:  Vitals:   06/30/20 0543  TempSrc: Oral  PainSc:                  [name]

## 2020-06-30 NOTE — Progress Notes (Signed)
[facility] Progress Note  1 Day Post-Op  Subjective: CC-  Abdomen sore but overall feeling ok. Pain well controlled on current regimen. Denies n/v. No flatus or BM. Tolerating ice chips.  Objective: Vital signs in last 24 hours: Temp:  [97.2 F (36.2 C)-99.2 F (37.3 C)] 97.7 F (36.5 C) (01/25 0543) Pulse Rate:  [58-83] 65 (01/25 0543) Resp:  [11-18] 18 (01/25 0543) BP: (103-179)/(66-92) 116/75 (01/25 0543) SpO2:  [95 %-100 %] 98 % (01/25 0543) Last BM Date: 06/25/20  Intake/Output from previous day: 01/24 0701 - 01/25 0700 In: 2775 [P.O.:60; I.V.:2000; IV Piggyback:715] Out: 1250 [Urine:1200; Blood:50] Intake/Output this shift: No intake/output data recorded.  PE: Gen:  Alert, NAD, pleasant Pulm: rate and effort normal Abd: Soft, mild distension, appropriately tender, hypoactive BS, honeycomb dressing to midline incision, lap incisions cdi Skin: warm and dry  Lab Results:  Recent Labs    06/29/20 0233 06/29/20 1557 06/30/20 0325  WBC 5.2  --  8.5  HGB 11.0* 11.6* 10.7*  HCT 33.9* 34.0* 34.5*  PLT 180  --  176   BMET Recent Labs    06/29/20 0233 06/29/20 1557 06/30/20 0325  NA 139 141 137  K 3.3* 4.2 4.4  CL 105 105 105  CO2 24  --  21*  GLUCOSE 97 85 143*  BUN <5* 3* 6*  CREATININE 0.66 0.50 0.60  CALCIUM 8.7*  --  8.6*   PT/INR No results for input(s): LABPROT, INR in the last 72 hours. CMP     Component Value Date/Time   NA 137 06/30/2020 0325   K 4.4 06/30/2020 0325   CL 105 06/30/2020 0325   CO2 21 (L) 06/30/2020 0325   GLUCOSE 143 (H) 06/30/2020 0325   BUN 6 (L) 06/30/2020 0325   CREATININE 0.60 06/30/2020 0325   CALCIUM 8.6 (L) 06/30/2020 0325   PROT <3.0 (L) 06/27/2020 1755   ALBUMIN 3.9 06/27/2020 1755   AST 18 06/27/2020 1755   ALT 14 06/27/2020 1755   ALKPHOS 64 06/27/2020 1755   BILITOT 0.7 06/27/2020 1755   GFRNONAA >60 06/30/2020 0325   GFRAA >60 10/29/2016 1032   Lipase     Component Value Date/Time    LIPASE 23 06/27/2020 1755       Studies/Results: US PELVIC COMPLETE WITH TRANSVAGINAL  Result Date: 06/28/2020 CLINICAL DATA:  Patient with recent CT and possible pelvic mass. EXAM: TRANSABDOMINAL AND TRANSVAGINAL ULTRASOUND OF PELVIS TECHNIQUE: Both transabdominal and transvaginal ultrasound examinations of the pelvis were performed. Transabdominal technique was performed for global imaging of the pelvis including uterus, ovaries, adnexal regions, and pelvic cul-de-sac. It was necessary to proceed with endovaginal exam following the transabdominal exam to visualize the adnexal structures. COMPARISON:  CT abdomen pelvis 11/26/2020 FINDINGS: Uterus Measurements: 5.3 x 2.8 x 4.3 cm = volume: 33.5 mL. No fibroids or other mass visualized. Endometrium Thickness: 9 mm. The endometrium is thickened. There is fluid and heterogeneous soft tissue within the endometrial canal. Cannot exclude endometrial mass. Right ovary No definite right ovarian tissue is visualized. Left ovary Not visualized. Other findings Within the right adnexa there is a 6.9 x 2.9 x 5.7 cm heterogeneous soft tissue mass. This conglomerate mass appears to be a soft tissue mass involving a small bowel loop within the right hemipelvis. There is a small amount of surrounding fluid. IMPRESSION: 1. There is a 6.9 cm soft tissue mass within the right adnexa which appears to be a soft tissue mass associated with a loop of small  bowel within the right lower abdomen. This appears to be located at the point of transition of the small bowel obstruction. Findings are concerning for possible small bowel lymphoma, carcinoid or small bowel adenocarcinoma. 2. The endometrium is thickened with a small amount of fluid and heterogeneous soft tissue. Cannot exclude underlying endometrial mass. Recommend non urgent gyn consultation and endometrial biopsy. 3. These results were called by telephone at the time of interpretation on 06/28/2020 at 10:23 am to provider Dr.  Sharon Seller, who verbally acknowledged these results. Electronically Signed   By: Lovey Newcomer M.D.   On: 06/28/2020 10:52    Anti-infectives: Anti-infectives (From admission, onward)   Start     Dose/Rate Route Frequency Ordered Stop   06/30/20 0600  cefoTEtan (CEFOTAN) 1 g in sodium chloride 0.9 % 100 mL IVPB        1 g 200 mL/hr over 30 Minutes Intravenous On call to O.R. 06/29/20 1332 06/29/20 1536       Assessment/Plan Type 2 diabetes Anemia - Hgb 10.7 from 11.6, monitor Sleep apnea  SBO, small bowel mass S/p Diagnostic laparoscopy, laparoscopic assisted small bowel resection 1/24 Dr. Kae Heller - POD#1 - surgical path pending - Intra-op findings reviewed with patient. Continue just sips of clears and await return in bowel function. Mobilize. Will ask PT to see. D/c foley once mobilizing. Will add tylenol and IV robaxin to help limit narcotic use.  ID - cefotetan periop FEN - IVF, sips of clears VTE - SCDs, lovenox Foley - d/c 1/25  Follow up - Dr. Kae Heller   LOS: 2 days    Wellington Hampshire, [facility] 06/30/2020, 9:35 AM Please see Amion for pager number during day hours 7:00am-4:30pm

## 2020-06-30 NOTE — Evaluation (Signed)
Physical Therapy Evaluation Patient Details Name: Abigail Houston MRN: [id number] DOB: 05/31/1951 Today's Date: 06/30/2020   History of Present Illness  70yo female s/p ex lap with small bowel resection done 06/29/20. PMH DM  Clinical Impression  Patient received in bed, very pleasant and cooperative with therapy. Needed heavier levels of assist for rolling/sequencing of rolling as well as pushing all the way up to sitting at EOB from her side, otherwise able to mobilize on a min guard level with RW with VC for hand placement with transfers. Did seem somewhat fatigued at end of gait distance. Left up in recliner with all needs met and nursing staff aware of patient status. Will benefit from f/u from [place] after DC.     Follow Up Recommendations Home health PT    Equipment Recommendations  Rolling walker with 5" wheels;3in1 (PT)    Recommendations for Other Services       Precautions / Restrictions Precautions Precautions: Fall;Other (comment) Precaution Comments: s/p abdominal surgery Restrictions Weight Bearing Restrictions: No      Mobility  Bed Mobility Overal bed mobility: Needs Assistance Bed Mobility: Rolling;Sidelying to Sit Rolling: Min assist Sidelying to sit: Mod assist       General bed mobility comments: MinA to roll all the way over on her side/VC for sequencing of rolling, then ModA to power up to midline from laying on her side    Transfers Overall transfer level: Needs assistance Equipment used: Rolling walker (2 wheeled) Transfers: Sit to/from Stand Sit to Stand: Min guard         General transfer comment: min guard for safety, VC for hand placement/sequencing, no physical assist given  Ambulation/Gait Ambulation/Gait assistance: Min guard Gait Distance (Feet): 180 Feet Assistive device: Rolling walker (2 wheeled) Gait Pattern/deviations: Step-through pattern;Decreased step length - right;Decreased step length - left;Decreased stride length;Trunk  flexed Gait velocity: decreased   General Gait Details: slow but steady with RW, cues for upright posture and gaze, otherwise no physical assist given but did seem fatigued at end of gait distance  Stairs            Wheelchair Mobility    Modified Rankin (Stroke Patients Only)       Balance Overall balance assessment: Mild deficits observed, not formally tested                                           Pertinent Vitals/Pain Pain Assessment: 0-10 Pain Score: 5  Pain Location: abdominal incisions Pain Descriptors / Indicators: Aching;Sore;Operative site guarding Pain Intervention(s): Limited activity within patient's tolerance;Monitored during session;Repositioned    Home Living Family/patient expects to be discharged to:: Private residence Living Arrangements: Alone Available Help at Discharge: Family;Available PRN/intermittently Type of Home: House Home Access: Level entry     Home Layout: Two level Home Equipment: None      Prior Function Level of Independence: Independent         Comments: driving, works part time in Marketing executive        Extremity/Trunk Assessment   Upper Extremity Assessment Upper Extremity Assessment: Overall WFL for tasks assessed    Lower Extremity Assessment Lower Extremity Assessment: Overall WFL for tasks assessed    Cervical / Trunk Assessment Cervical / Trunk Assessment: Normal  Communication   Communication: No difficulties  Cognition Arousal/Alertness: Awake/alert Behavior During Therapy: [facility]  for tasks assessed/performed Overall Cognitive Status: Within Functional Limits for tasks assessed                                        General Comments      Exercises     Assessment/Plan    PT Assessment Patient needs continued PT services  PT Problem List Decreased strength;Decreased knowledge of use of DME;Obesity;Decreased activity  tolerance;Decreased safety awareness;Decreased balance;Decreased knowledge of precautions;Pain;Decreased mobility;Decreased coordination       PT Treatment Interventions DME instruction;Balance training;Gait training;Stair training;Functional mobility training;Patient/family education;Therapeutic activities;Therapeutic exercise    PT Goals (Current goals can be found in the Care Plan section)  Acute Rehab PT Goals Patient Stated Goal: go home when medically ready PT Goal Formulation: With patient Time For Goal Achievement: 07/14/20 Potential to Achieve Goals: Good    Frequency Min 3X/week   Barriers to discharge        Co-evaluation               AM-PAC PT "6 Clicks" Mobility  Outcome Measure Help needed turning from your back to your side while in a flat bed without using bedrails?: A Little Help needed moving from lying on your back to sitting on the side of a flat bed without using bedrails?: A Lot Help needed moving to and from a bed to a chair (including a wheelchair)?: A Little Help needed standing up from a chair using your arms (e.g., wheelchair or bedside chair)?: A Little Help needed to walk in hospital room?: A Little Help needed climbing 3-5 steps with a railing? : A Little 6 Click Score: 17    End of Session Equipment Utilized During Treatment: Gait belt Activity Tolerance: Patient tolerated treatment well Patient left: in chair;with call bell/phone within reach Nurse Communication: Mobility status PT Visit Diagnosis: Difficulty in walking, not elsewhere classified (R26.2);Muscle weakness (generalized) (M62.81);Pain Pain - Right/Left:  (abdominal) Pain - part of body:  (abdominal)    Time: 1594-7076 PT Time Calculation (min) (ACUTE ONLY): 23 min   Charges:   PT Evaluation $PT Eval Low Complexity: 1 Low PT Treatments $Gait Training: 8-22 mins        Windell Norfolk, DPT, PN1   Supplemental Physical Therapist [place]    Pager  [phone] Acute Rehab Office [phone]

## 2020-06-30 NOTE — Progress Notes (Signed)
   Subjective:   No acute events overnight.  Patient post-op day 1 small bowel resection of mass. Patient states she is doing well, she just received pain medication upon our interview. She was resting in bed comfortably. She states she has had belching episodes, denies passing flatus since the procedure. She notes tolerating one ice chip, but has not had an appetite to try an advance her diet. No other concerns at the time of my examination.  Objective:  Vital signs in last 24 hours: Vitals:   06/29/20 2131 06/29/20 2139 06/30/20 0221 06/30/20 0543  BP:  126/75 103/66 116/75  Pulse: 68 (!) 59 (!) 58 65  Resp: 16 18 18 18   Temp:  97.7 F (36.5 C) (!) 97.4 F (36.3 C) 97.7 F (36.5 C)  TempSrc:  Oral Oral Oral  SpO2: 95% 95% 96% 98%  Weight:      Height:       Constitutional: well-appearing, resting comfortably in bed.  Cardiovascular: regular rate and rhythm Pulmonary/Chest: normal work of breathing on room air Abdominal: Incisions clean, dry and intact. MSK: normal bulk and tone Neurological: alert & oriented x 3 Skin: warm and dry Psych: Calm mood   CBC Latest Ref Rng & Units 06/30/2020 06/29/2020 06/29/2020  WBC 4.0 - 10.5 K/uL 8.5 - 5.2  Hemoglobin 12.0 - 15.0 g/dL 10.7(L) 11.6(L) 11.0(L)  Hematocrit 36.0 - 46.0 % 34.5(L) 34.0(L) 33.9(L)  Platelets 150 - 400 K/uL 176 - 180   BMP Latest Ref Rng & Units 06/30/2020 06/29/2020 06/29/2020  Glucose 70 - 99 mg/dL 143(H) 85 97  BUN 8 - 23 mg/dL 6(L) 3(L) <5(L)  Creatinine 0.44 - 1.00 mg/dL 0.60 0.50 0.66  Sodium 135 - 145 mmol/L 137 141 139  Potassium 3.5 - 5.1 mmol/L 4.4 4.2 3.3(L)  Chloride 98 - 111 mmol/L 105 105 105  CO2 22 - 32 mmol/L 21(L) - 24  Calcium 8.9 - 10.3 mg/dL 8.6(L) - 8.7(L)     Assessment/Plan:  [name] is a 70 yo female with PMH of arthritis, IDA, T2DM, and sleep apnea who present presented for evaluation of worsening abdominal pain, nausea and vomiting and found to have small bowel obstruction.    #Small bowel obstruction 2/2 Right adnexal mass PO day 1 small bowel resection. Patient tolerated procedure well without complications. Starting on clears today. Awaiting return of bowel function. Pending pathology of mass. Gen surg continuing to follow. -clear liquid diet -IV LR at 75 mL/hr, will continue until patient increases oral intake -IV Dilaudid 0.5-1 mg q2h prn for pain -Zofran 4 mg q4h prn for nausea -daily BMP  #T2DM --CBG monitoring q8h in setting of NPO status --D5 if patient becomes hypoglycemic  #OSA Stable. -cpap ordered  #Iron deficiency anemia Patient has a history of iron deficiency anemia on supplementation with ferrous sulfate 325 mg daily. Hemoglobin currently normal at 10.7. Continue to monitor closely. -daily CBC -transfuse if hgb under 7  #Foley Catheter Removal Foley removed right before our exam. Will follow up to make certain patient able to urinate.   Diet: Clears VTE: Heparin Fluids: LR 75 continuous Code Status: Full Code PT/OT: Home Health Family Update: Patient states family has been updated  Anticipate Discharge: Home pending possible procedure by general surgery   [place]  Internal Medicine Resident PGY-1 [place]  Pager: [phone]

## 2020-07-01 LAB — CBC
HCT: 33.5 % — ABNORMAL LOW (ref 36.0–46.0)
Hemoglobin: 10.4 g/dL — ABNORMAL LOW (ref 12.0–15.0)
MCH: 28.3 pg (ref 26.0–34.0)
MCHC: 31 g/dL (ref 30.0–36.0)
MCV: 91 fL (ref 80.0–100.0)
Platelets: 165 10*3/uL (ref 150–400)
RBC: 3.68 MIL/uL — ABNORMAL LOW (ref 3.87–5.11)
RDW: 15.1 % (ref 11.5–15.5)
WBC: 8.2 10*3/uL (ref 4.0–10.5)
nRBC: 0 % (ref 0.0–0.2)

## 2020-07-01 LAB — GLUCOSE, CAPILLARY
Glucose-Capillary: 96 mg/dL (ref 70–99)
Glucose-Capillary: 84 mg/dL (ref 70–99)
Glucose-Capillary: 142 mg/dL — ABNORMAL HIGH (ref 70–99)
Glucose-Capillary: 114 mg/dL — ABNORMAL HIGH (ref 70–99)
Glucose-Capillary: 141 mg/dL — ABNORMAL HIGH (ref 70–99)

## 2020-07-01 LAB — BASIC METABOLIC PANEL
Anion gap: 10 (ref 5–15)
BUN: 6 mg/dL — ABNORMAL LOW (ref 8–23)
CO2: 24 mmol/L (ref 22–32)
Calcium: 8.7 mg/dL — ABNORMAL LOW (ref 8.9–10.3)
Chloride: 103 mmol/L (ref 98–111)
Creatinine, Ser: 0.63 mg/dL (ref 0.44–1.00)
GFR, Estimated: >60 mL/min (ref >60)
Glucose, Bld: 107 mg/dL — ABNORMAL HIGH (ref 70–99)
Potassium: 3.6 mmol/L (ref 3.5–5.1)
Sodium: 137 mmol/L (ref 135–145)

## 2020-07-01 LAB — MAGNESIUM: Magnesium: 1.8 mg/dL (ref 1.7–2.4)

## 2020-07-01 MED ORDER — POTASSIUM CHLORIDE 10 MEQ/100ML IV SOLN
10.0000 meq | INTRAVENOUS | Status: AC
  Administered 2020-07-01 (×2): 10 meq via INTRAVENOUS
  Filled 2020-07-01 (×2): qty 100

## 2020-07-01 NOTE — Evaluation (Signed)
Occupational Therapy Evaluation Patient Details Name: Abigail Houston MRN: [id number] DOB: [birth year] Today's Date: 07/01/2020    History of Present Illness 70yo female s/p ex lap with small bowel resection done 06/29/20. PMH DM   Clinical Impression   Patient admitted for the procedure above.  She is doing quite well with all functional mobility and self care.  Currently, she is using a 2WRW to take some pressure off of her abdomen. In addition, she needs up to [place] for lower body ADL seated.  PTA, she is independent with all mobility and self care.  She continues to work part time and drives.  Acute OT will assign a 1x/wk frequency to check on her, if she remains in the hospital for longer than expected.  Patient is open to [facility] PT if recommended.   No post acute OT is needed.      Follow Up Recommendations  No OT follow up    Equipment Recommendations  None recommended by OT    Recommendations for Other Services       Precautions / Restrictions Precautions Precautions: Other (comment) Precaution Comments: s/p abdominal surgery Restrictions Weight Bearing Restrictions: No      Mobility Bed Mobility               General bed mobility comments: up in the recliner    Transfers Overall transfer level: Needs assistance Equipment used: Rolling walker (2 wheeled) Transfers: Sit to/from Stand Sit to Stand: Supervision         General transfer comment: MGA for STS from RW to toilet surface (increased assist due to pt urgency/rushing) and Supervision for sit<>stand from recliner<>RW    Balance Overall balance assessment: Mild deficits observed, not formally tested                                         ADL either performed or assessed with clinical judgement   ADL Overall ADL's : Needs assistance/impaired     Grooming: Wash/dry hands;Wash/dry face;Supervision/safety;Standing       Lower Body Bathing: Minimal assistance;Sitting/lateral leans    Upper Body Dressing : Set up;Sitting   Lower Body Dressing: Minimal assistance;Sitting/lateral leans               Functional mobility during ADLs: Supervision/safety;Rolling walker       Vision Baseline Vision/History: Wears glasses Wears Glasses: At all times Patient Visual Report: No change from baseline       Perception     Praxis      Pertinent Vitals/Pain Pain Assessment: Faces Faces Pain Scale: Hurts a little bit Pain Location: abdominal incisions Pain Descriptors / Indicators: Aching Pain Intervention(s): Monitored during session     Hand Dominance Left   Extremity/Trunk Assessment Upper Extremity Assessment Upper Extremity Assessment: Overall WFL for tasks assessed       Cervical / Trunk Assessment Cervical / Trunk Assessment: Normal   Communication Communication Communication: No difficulties   Cognition Arousal/Alertness: Awake/alert Behavior During Therapy: WFL for tasks assessed/performed Overall Cognitive Status: Within Functional Limits for tasks assessed                                 General Comments: pleasantly cooperative   General Comments  patient is using RW currently to take pressure off of her abdomen    Exercises  Shoulder Instructions      Home Living Family/patient expects to be discharged to:: Private residence Living Arrangements: Alone Available Help at Discharge: Family;Available PRN/intermittently Type of Home: House Home Access: Level entry     Home Layout: Two level Alternate Level Stairs-Number of Steps: flight with R ascending rail   Bathroom Shower/Tub: Teacher, early years/pre: Standard     Home Equipment: None          Prior Functioning/Environment Level of Independence: Independent        Comments: driving, works part time in Economist        OT Problem List: Pain      OT Treatment/Interventions: Field seismologist;Therapeutic activities     OT Goals(Current goals can be found in the care plan section) Acute Rehab OT Goals Patient Stated Goal: return back home OT Goal Formulation: With patient Time For Goal Achievement: 07/15/20 Potential to Achieve Goals: Good ADL Goals Pt Will Perform Lower Body Bathing: with modified independence;sit to/from stand Pt Will Perform Lower Body Dressing: with modified independence;sit to/from stand Pt Will Transfer to Toilet: with modified independence;regular height toilet;ambulating Pt Will Perform Toileting - Clothing Manipulation and hygiene: Independently;sit to/from stand  OT Frequency: Min 1X/week   Barriers to D/C:    none noted       Co-evaluation              AM-PAC OT "6 Clicks" Daily Activity     Outcome Measure Help from another person eating meals?: None Help from another person taking care of personal grooming?: None Help from another person toileting, which includes using toliet, bedpan, or urinal?: A Little Help from another person bathing (including washing, rinsing, drying)?: A Little Help from another person to put on and taking off regular upper body clothing?: None Help from another person to put on and taking off regular lower body clothing?: A Little 6 Click Score: 21   End of Session Equipment Utilized During Treatment: Rolling walker Nurse Communication: Other (comment)  Activity Tolerance: Patient tolerated treatment well Patient left: in chair;with call bell/phone within reach;with family/visitor present  OT Visit Diagnosis: Pain                Time: 1515-1530 OT Time Calculation (min): 15 min Charges:  OT General Charges $OT Visit: 1 Visit OT Evaluation $OT Eval Moderate Complexity: 1 Mod  07/01/2020  Rich, OTR/L  Acute Rehabilitation Services  Office:  [phone]   Metta Clines 07/01/2020, 3:37 PM

## 2020-07-01 NOTE — Progress Notes (Signed)
Physical Therapy Treatment Patient Details Name: Abigail Houston MRN: [id number] DOB: 21-Feb-1951 Today's Date: 07/01/2020    History of Present Illness 70yo female s/p ex lap with small bowel resection done 06/29/20. PMH DM    PT Comments    Pt up in chair, agreeable to therapy session with good participation and tolerance for session. Pt performed gait trial ~245ft using RW and Supervision with chair follow for safety and stair trial using HR/HHA and min guard x8 steps without loss of balance and minimal fatigue reported after activity. Reviewed log rolling strategy but pt deferring to attempt during session as she wants to get a bath and NT entering room to assist her with this. Will plan to practice bed mobility/log rolling next session. Pt continues to benefit from PT services to progress toward functional mobility goals. Continue to recommend HHPT post-acute, anticipate pt safe to DC home with PRN supervision/assist from family once medically cleared.   Follow Up Recommendations  Home health PT     Equipment Recommendations  Rolling walker with 5" wheels;3in1 (PT)    Recommendations for Other Services       Precautions / Restrictions Precautions Precautions: Fall;Other (comment) Precaution Comments: s/p abdominal surgery Restrictions Weight Bearing Restrictions: No    Mobility  Bed Mobility               General bed mobility comments: up in chair pre/post, pt given visual/verbal demo for log roll sit<>supine and verbalized understanding  Transfers Overall transfer level: Needs assistance Equipment used: Rolling walker (2 wheeled) Transfers: Sit to/from Stand Sit to Stand: Supervision;Min guard         General transfer comment: MGA for STS from RW to toilet surface (increased assist due to pt urgency/rushing) and Supervision for sit<>stand from recliner<>RW  Ambulation/Gait Ambulation/Gait assistance: Supervision;+2 safety/equipment (chair follow) Gait Distance  (Feet): 200 Feet (277ft, then 64ft in room) Assistive device: Rolling walker (2 wheeled) Gait Pattern/deviations: Step-through pattern;Decreased step length - right;Decreased step length - left;Decreased stride length;Trunk flexed     General Gait Details: slow but steady with RW, cues for upright posture and gaze, otherwise no physical assist given, mild fatigue at end of gait trial   Stairs Stairs: Yes Stairs assistance: Min guard Stair Management: One rail Left;Forwards;Step to pattern (HHA on RUE for safety) Number of Stairs: 8 (4 steps x2 trials) General stair comments: pt given instruction on safety/sequencing and good carryover of instruction, HHA on RUE for safety and increased time to perform but no overt LOB   Wheelchair Mobility    Modified Rankin (Stroke Patients Only)       Balance Overall balance assessment: Mild deficits observed, not formally tested                                          Cognition Arousal/Alertness: Awake/alert Behavior During Therapy: WFL for tasks assessed/performed Overall Cognitive Status: Within Functional Limits for tasks assessed                                 General Comments: pleasantly cooperative      Exercises      General Comments General comments (skin integrity, edema, etc.): pt could benefit from higher level balance assessment next session if does not DC      Pertinent Vitals/Pain Pain Assessment: Faces Faces  Pain Scale: Hurts a little bit Pain Location: abdominal incisions Pain Descriptors / Indicators: Aching;Sore;Operative site guarding Pain Intervention(s): Monitored during session;Repositioned (reviewed log rolling via verbal/visual demo)  SpO2 97% on RA and HR 70-80's bpm  Home Living                      Prior Function            PT Goals (current goals can now be found in the care plan section) Acute Rehab PT Goals Patient Stated Goal: go home when medically  ready PT Goal Formulation: With patient Time For Goal Achievement: 07/14/20 Potential to Achieve Goals: Good Progress towards PT goals: Progressing toward goals    Frequency    Min 3X/week      PT Plan Current plan remains appropriate    Co-evaluation              AM-PAC PT "6 Clicks" Mobility   Outcome Measure  Help needed turning from your back to your side while in a flat bed without using bedrails?: A Little Help needed moving from lying on your back to sitting on the side of a flat bed without using bedrails?: A Lot Help needed moving to and from a bed to a chair (including a wheelchair)?: A Little Help needed standing up from a chair using your arms (e.g., wheelchair or bedside chair)?: A Little Help needed to walk in hospital room?: A Little Help needed climbing 3-5 steps with a railing? : A Little 6 Click Score: 17    End of Session Equipment Utilized During Treatment: Gait belt Activity Tolerance: Patient tolerated treatment well Patient left: in chair;with call bell/phone within reach Nurse Communication: Mobility status PT Visit Diagnosis: Difficulty in walking, not elsewhere classified (R26.2);Muscle weakness (generalized) (M62.81);Pain Pain - part of body:  (abdomen)     Time: 1610-9604 PT Time Calculation (min) (ACUTE ONLY): 26 min  Charges:  $Gait Training: 8-22 mins $Therapeutic Activity: 8-22 mins                     Ismar Yabut P., PTA Acute Rehabilitation Services Pager: [phone] Office: [place] 07/01/2020, 2:18 PM

## 2020-07-01 NOTE — Progress Notes (Signed)
   Subjective:   No acute events overnight.  Patient post-op day 2.  Patient reports she is a little abdominal soreness today, hasn't received pain medication this morning. States she is not passing gas yet. Reports an episode of nausea but no emesis yesterday which was alleviated with Zofran. Tolerated some soup and a small amount of jello yesterday.  Objective:  Vital signs in last 24 hours: Vitals:   06/30/20 2321 06/30/20 2321 06/30/20 2358 07/01/20 0500  BP: (!) 152/74 (!) 152/74  (!) 145/76  Pulse: 65 64 62 72  Resp: 20 20 16 16   Temp: 98.5 F (36.9 C) 98.5 F (36.9 C)  97.6 F (36.4 C)  TempSrc: Oral Oral  Axillary  SpO2: 99% 97% 98% 99%  Weight:      Height:       Constitutional: well-appearing, resting comfortably in bed.  Cardiovascular: regular rate and rhythm Pulmonary/Chest: normal work of breathing on room air Abdominal: Hypoactive bowel sounds MSK: normal bulk and tone Neurological: alert & oriented x 3 Skin: warm and dry Psych: Calm mood   CBC Latest Ref Rng & Units 07/01/2020 06/30/2020 06/29/2020  WBC 4.0 - 10.5 K/uL 8.2 8.5 -  Hemoglobin 12.0 - 15.0 g/dL 10.4(L) 10.7(L) 11.6(L)  Hematocrit 36.0 - 46.0 % 33.5(L) 34.5(L) 34.0(L)  Platelets 150 - 400 K/uL 165 176 -   BMP Latest Ref Rng & Units 07/01/2020 06/30/2020 06/29/2020  Glucose 70 - 99 mg/dL 107(H) 143(H) 85  BUN 8 - 23 mg/dL 6(L) 6(L) 3(L)  Creatinine 0.44 - 1.00 mg/dL 0.63 0.60 0.50  Sodium 135 - 145 mmol/L 137 137 141  Potassium 3.5 - 5.1 mmol/L 3.6 4.4 4.2  Chloride 98 - 111 mmol/L 103 105 105  CO2 22 - 32 mmol/L 24 21(L) -  Calcium 8.9 - 10.3 mg/dL 8.7(L) 8.6(L) -     Assessment/Plan:  [name] is a 70 yo female with PMH of arthritis, IDA, T2DM, and sleep apnea who present presented for evaluation of worsening abdominal pain, nausea and vomiting and found to have small bowel obstruction.   #Small bowel obstruction 2/2 Right adnexal mass PO day 2 small bowel resection. Started on  clears yesterday. Tolerated some liquids yesterday without n/v. Denies passing gas. Awaiting return of bowel function. Pending pathology of mass. Gen surg continuing to follow. -clear liquid diet -IV LR at 75 mL/hr, will continue until patient increases oral intake -IV Dilaudid 0.5-1 mg q2h prn for pain -Zofran 4 mg q4h prn for nausea -daily BMP  #T2DM --CBG monitoring q8h in setting of NPO status --D5 if patient becomes hypoglycemic  #OSA Stable. -cpap ordered  #Iron deficiency anemia Patient has a history of iron deficiency anemia on supplementation with ferrous sulfate 325 mg daily. Hemoglobin currently normal at 10.4. Continue to monitor closely. -daily CBC -transfuse if hgb under 7  #Foley Catheter Removal Foley removed right before our exam. Will follow up to make certain patient able to urinate.   Diet: Clears VTE: Heparin Fluids: LR 75 continuous Code Status: Full Code PT/OT: Home Health Family Update: Daughter at bedside  Anticipate Discharge: Home pending possible procedure by general surgery   [place]  Internal Medicine Resident PGY-1 [place]  Pager: [phone]

## 2020-07-01 NOTE — Progress Notes (Signed)
[facility] Progress Note  2 Days Post-Op  Subjective: CC-  Abdomen still sore but pain well controlled. Tolerating sips of clears without nausea or vomiting. No flatus or BM. Ambulated with PT yesterday who is recommending HH PT.  Objective: Vital signs in last 24 hours: Temp:  [97.6 F (36.4 C)-98.5 F (36.9 C)] 97.6 F (36.4 C) (01/26 0500) Pulse Rate:  [62-72] 72 (01/26 0500) Resp:  [16-20] 16 (01/26 0500) BP: (145-154)/(72-76) 145/76 (01/26 0500) SpO2:  [97 %-99 %] 99 % (01/26 0500) Last BM Date: 06/25/20  Intake/Output from previous day: 01/25 0701 - 01/26 0700 In: 1905.3 [P.O.:60; I.V.:1795.3; IV Piggyback:50] Out: -  Intake/Output this shift: No intake/output data recorded.  PE: Gen:  Alert, NAD, pleasant Pulm: rate and effort normal Abd: Soft, mild distension, appropriately tender, few BS heard, honeycomb dressing to midline incision, lap incisions cdi Skin: warm and dry   Lab Results:  Recent Labs    06/30/20 0325 07/01/20 0026  WBC 8.5 8.2  HGB 10.7* 10.4*  HCT 34.5* 33.5*  PLT 176 165   BMET Recent Labs    06/30/20 0325 07/01/20 0026  NA 137 137  K 4.4 3.6  CL 105 103  CO2 21* 24  GLUCOSE 143* 107*  BUN 6* 6*  CREATININE 0.60 0.63  CALCIUM 8.6* 8.7*   PT/INR No results for input(s): LABPROT, INR in the last 72 hours. CMP     Component Value Date/Time   NA 137 07/01/2020 0026   K 3.6 07/01/2020 0026   CL 103 07/01/2020 0026   CO2 24 07/01/2020 0026   GLUCOSE 107 (H) 07/01/2020 0026   BUN 6 (L) 07/01/2020 0026   CREATININE 0.63 07/01/2020 0026   CALCIUM 8.7 (L) 07/01/2020 0026   PROT <3.0 (L) 06/27/2020 1755   ALBUMIN 3.9 06/27/2020 1755   AST 18 06/27/2020 1755   ALT 14 06/27/2020 1755   ALKPHOS 64 06/27/2020 1755   BILITOT 0.7 06/27/2020 1755   GFRNONAA >60 07/01/2020 0026   GFRAA >60 10/29/2016 1032   Lipase     Component Value Date/Time   LIPASE 23 06/27/2020 1755       Studies/Results: No results  found.  Anti-infectives: Anti-infectives (From admission, onward)   Start     Dose/Rate Route Frequency Ordered Stop   06/30/20 0600  cefoTEtan (CEFOTAN) 1 g in sodium chloride 0.9 % 100 mL IVPB        1 g 200 mL/hr over 30 Minutes Intravenous On call to O.R. 06/29/20 1332 06/29/20 1536       Assessment/Plan Type 2 diabetes Anemia - Hgb 10.4 from 10.7, monitor Sleep apnea  SBO, small bowel mass S/p Diagnostic laparoscopy, laparoscopic assisted small bowel resection 1/24 Dr. Kae Heller - POD#2 - surgical path pending. - Continue CLD and await return in bowel function. Replete K. Continue mobilizing. HH PT and DME order placed, will ask OT to see. Labs in AM.  ID - cefotetan periop FEN - IVF, CLD VTE - SCDs, lovenox Foley - d/c 1/25  Follow up - Dr. Kae Heller   LOS: 3 days    Abigail Houston, [facility] 07/01/2020, 8:26 AM Please see Amion for pager number during day hours 7:00am-4:30pm

## 2020-07-01 NOTE — TOC Initial Note (Signed)
Transition of Care [facility]) - Initial/Assessment Note    Patient Details  Name: Abigail Houston MRN: [id number] Date of Birth: Jun 16, 1950  Transition [facility]) CM/SW Contact:    Bethena Roys, RN Phone Number: 07/01/2020, 6:30 PM  Clinical Narrative: Patient presented for abdominal pain. Prior to arrival patient was independent from home alone. Patient has support of her daughter. Patient is in need of physical therapy and home health aide. Case Manager spoke with patient and daughter agreeable to services- Case Manager called [facility] agency in network for services-accepted the patient. Start of care to begin within 24-48 hours post transition home. We discussed durable medical equipment- ordered RW and 3n1 via [employer]- will be delivered to the room prior to transition home.                Expected Discharge Plan: [place] Barriers to Discharge: No Barriers Identified   Patient Goals and CMS Choice Patient states their goals for this hospitalization and ongoing recovery are:: to return home- independent prior to [facility]   Choice offered to / list presented to :  (Case Manaager discussed with family that calls would need to be made to agencies to see who is in network.)  Expected Discharge Plan and Services Expected Discharge Plan: [place] In-house Referral: NA Discharge Planning Services: CM Consult Post Acute Care Choice: [place] arrangements for the past 2 months: Single Family Home                 DME Arranged: Bedside commode,Walker rolling DME Agency: [employer] Date DME Agency Contacted: 07/01/20 Time DME Agency Contacted: 1625 Representative spoke with at DME Agency: Freda Munro HH Arranged: Nurse's Aide,PT [place] Agency: [facility] Date [place]: 07/01/20 Time [place]: 1635 Representative spoke with at [place]: Danae Chen  Prior Living  Arrangements/Services Living arrangements for the past 2 months: [place] with:: Self (has support of daughter.) Patient language and need for interpreter reviewed:: Yes Do you feel safe going back to the place where you live?: Yes      Need for Family Participation in Patient Care: Yes (Comment) Care giver support system in place?: Yes (comment)   Criminal Activity/Legal Involvement Pertinent to Current Situation/Hospitalization: No - Comment as needed  Activities of Daily Living Home Assistive Devices/Equipment: CBG Meter ADL Screening (condition at time of admission) Patient's cognitive ability adequate to safely complete daily activities?: Yes Is the patient deaf or have difficulty hearing?: No Does the patient have difficulty seeing, even when wearing glasses/contacts?: No Does the patient have difficulty concentrating, remembering, or making decisions?: No Patient able to express need for assistance with ADLs?: Yes Does the patient have difficulty dressing or bathing?: No Independently performs ADLs?: Yes (appropriate for developmental age) Does the patient have difficulty walking or climbing stairs?: No Weakness of Legs: None Weakness of Arms/Hands: None  Permission Sought/Granted Permission sought to share information with : Family Estate agent Permission granted to share information with : Yes, Verbal Permission Granted     Permission granted to share info w AGENCY: [employer], [facility]        Emotional Assessment Appearance:: Appears stated age Attitude/Demeanor/Rapport: Engaged Affect (typically observed): Appropriate Orientation: : Oriented to Situation,Oriented to  Time,Oriented to Place,Oriented to Self Alcohol / Substance Use: Not Applicable Psych Involvement: No (comment)  Admission diagnosis:  Pelvic mass [R19.00] SBO (small bowel obstruction) ([place]) [K56.609] Patient Active Problem  List    Diagnosis Date Noted  . SBO (small bowel obstruction) ([place]) 06/28/2020  . Ankle edema, bilateral 02/20/2018  . Nocturia more than twice per night 02/20/2018  . Obesity (BMI 35.0-39.9 without comorbidity) 02/20/2018  . Type 2 diabetes mellitus with diabetic mononeuropathy, without long-term current use of insulin ([place]) 02/20/2018  . Snoring 02/20/2018  . Varicose veins of bilateral lower extremities with other complications 63/84/5364  . Slow transit constipation 03/24/2016   PCP:  Bernerd Limbo, MD Pharmacy:   [facility] [place], [place] - [address] [place] [address] Phone: [phone] Fax: [phone]     Readmission Risk Interventions No flowsheet data found.

## 2020-07-01 NOTE — Progress Notes (Signed)
Pt refused cpap she states that the mask hurts her head and shes unable to sleep. PT instructed to call if she changes her mind.

## 2020-07-02 LAB — CBC
HCT: 32.7 % — ABNORMAL LOW (ref 36.0–46.0)
Hemoglobin: 10.8 g/dL — ABNORMAL LOW (ref 12.0–15.0)
MCH: 28.9 pg (ref 26.0–34.0)
MCHC: 33 g/dL (ref 30.0–36.0)
MCV: 87.4 fL (ref 80.0–100.0)
Platelets: UNDETERMINED 10*3/uL (ref 150–400)
RBC: 3.74 MIL/uL — ABNORMAL LOW (ref 3.87–5.11)
RDW: 15.2 % (ref 11.5–15.5)
WBC: 5.2 10*3/uL (ref 4.0–10.5)
nRBC: 0 % (ref 0.0–0.2)

## 2020-07-02 LAB — BASIC METABOLIC PANEL
Anion gap: 12 (ref 5–15)
BUN: <5 mg/dL — ABNORMAL LOW (ref 8–23)
CO2: 23 mmol/L (ref 22–32)
Calcium: 8.6 mg/dL — ABNORMAL LOW (ref 8.9–10.3)
Chloride: 104 mmol/L (ref 98–111)
Creatinine, Ser: 0.63 mg/dL (ref 0.44–1.00)
GFR, Estimated: >60 mL/min (ref >60)
Glucose, Bld: 100 mg/dL — ABNORMAL HIGH (ref 70–99)
Potassium: 3.4 mmol/L — ABNORMAL LOW (ref 3.5–5.1)
Sodium: 139 mmol/L (ref 135–145)

## 2020-07-02 LAB — MAGNESIUM: Magnesium: 1.8 mg/dL (ref 1.7–2.4)

## 2020-07-02 LAB — GLUCOSE, CAPILLARY
Glucose-Capillary: 101 mg/dL — ABNORMAL HIGH (ref 70–99)
Glucose-Capillary: 110 mg/dL — ABNORMAL HIGH (ref 70–99)
Glucose-Capillary: 92 mg/dL (ref 70–99)
Glucose-Capillary: 97 mg/dL (ref 70–99)

## 2020-07-02 MED ORDER — BOOST / RESOURCE BREEZE PO LIQD CUSTOM
1.0000 | Freq: Three times a day (TID) | ORAL | Status: DC
  Administered 2020-07-02 – 2020-07-03 (×5): 1 via ORAL

## 2020-07-02 MED ORDER — HYDROMORPHONE HCL 1 MG/ML IJ SOLN: 0.5000 mg | INTRAMUSCULAR | Status: DC | PRN

## 2020-07-02 MED ORDER — OXYCODONE HCL 5 MG PO TABS
5.0000 mg | ORAL_TABLET | ORAL | Status: DC | PRN
  Administered 2020-07-02 – 2020-07-03 (×2): 5 mg via ORAL
  Filled 2020-07-02 (×2): qty 1

## 2020-07-02 MED ORDER — POTASSIUM CHLORIDE 20 MEQ PO PACK
40.0000 meq | PACK | Freq: Two times a day (BID) | ORAL | Status: AC
  Administered 2020-07-02 (×2): 40 meq via ORAL
  Filled 2020-07-02 (×2): qty 2

## 2020-07-02 NOTE — Progress Notes (Signed)
   Subjective:   No acute events overnight.  Patient post-op day 3  Patient reports passing gas overnight. Tolerating clears well. Abdominal soreness is improving, denies drainage from surgical sites. No nausea or vomiting overnight. She has no new complaints.  Objective:  Vital signs in last 24 hours: Vitals:   07/01/20 0500 07/01/20 1503 07/01/20 2032 07/02/20 0404  BP: (!) 145/76 (!) 143/92 140/77 112/69  Pulse: 72 90 80 75  Resp: 16 18 20 20   Temp: 97.6 F (36.4 C) 98.4 F (36.9 C) 98.1 F (36.7 C) 98.2 F (36.8 C)  TempSrc: Axillary Oral Oral Oral  SpO2: 99% 100% 95% 96%  Weight:      Height:       Constitutional: well-appearing, resting comfortably in bed.  Cardiovascular: regular rate and rhythm Pulmonary/Chest: normal work of breathing on room air Abdominal: Hypoactive bowel sounds MSK: normal bulk and tone Neurological: alert & oriented x 3 Skin: warm and dry Psych: Calm mood   CBC Latest Ref Rng & Units 07/02/2020 07/01/2020 06/30/2020  WBC 4.0 - 10.5 K/uL 5.2 8.2 8.5  Hemoglobin 12.0 - 15.0 g/dL 10.8(L) 10.4(L) 10.7(L)  Hematocrit 36.0 - 46.0 % 32.7(L) 33.5(L) 34.5(L)  Platelets 150 - 400 K/uL PLATELET CLUMPS NOTED ON SMEAR, UNABLE TO ESTIMATE 165 176   BMP Latest Ref Rng & Units 07/02/2020 07/01/2020 06/30/2020  Glucose 70 - 99 mg/dL 100(H) 107(H) 143(H)  BUN 8 - 23 mg/dL <5(L) 6(L) 6(L)  Creatinine 0.44 - 1.00 mg/dL 0.63 0.63 0.60  Sodium 135 - 145 mmol/L 139 137 137  Potassium 3.5 - 5.1 mmol/L 3.4(L) 3.6 4.4  Chloride 98 - 111 mmol/L 104 103 105  CO2 22 - 32 mmol/L 23 24 21(L)  Calcium 8.9 - 10.3 mg/dL 8.6(L) 8.7(L) 8.6(L)     Assessment/Plan:  Abigail Houston is a 70 yo female with PMH of arthritis, IDA, T2DM, and sleep apnea who present presented for evaluation of worsening abdominal pain, nausea and vomiting and found to have small bowel obstruction.   #Small bowel obstruction 2/2 Right adnexal mass #S/P Small Bowel Resection Day 3 Tolerating  clears well. Denies N/V. Passing gas this am. Awaiting return of bowel function. Pending pathology of mass. Gen surg continuing to follow. -clear liquid diet -IV LR at 10 mL/hr, will continue until patient increases oral intake -IV Dilaudid 0.5-1 mg q2h prn for pain -Zofran 4 mg q6h prn for nausea -daily BMP  #Hypokalemia K of 3.4.  -40 meq klorcon powder BID  #T2DM --CBG monitoring q8h in setting of NPO status --D5 if patient becomes hypoglycemic  #OSA Refused CPAP last night -cpap ordered  #Iron deficiency anemia Patient has a history of iron deficiency anemia on supplementation with ferrous sulfate 325 mg daily. Hemoglobin currently normal at 10.8. Continue to monitor closely. -daily CBC -transfuse if hgb under 7  Diet: Clears, boost supplement VTE: Lovenox Fluids: LR 10 continuous Code Status: Full Code PT/OT: Home Health  Anticipate Discharge: Home pending possible return to bowel function  [place]  Internal Medicine Resident PGY-1 [place]  Pager: [phone]

## 2020-07-02 NOTE — Progress Notes (Signed)
Physical Therapy Treatment Patient Details Name: Abigail Houston MRN: [id number] DOB: 1950-06-08 Today's Date: 07/02/2020    History of Present Illness 70yo female s/p ex lap with small bowel resection done 06/29/20. PMH DM    PT Comments    Pt up in chair, agreeable to therapy session, with good participation and improved activity tolerance this date. Pt progressed gait distance to 346ft using RW and Supervision and able to demonstrate log rolling technique for bed mobility with flat HOB and no rails and Supervision. Slow gait speed ~0.3 m/s indicates an increased risk of falls for household ambulation and pt will benefit from increased PRN supervision/assist at home initially. Pt continues to benefit from PT services to progress toward functional mobility goals. Anticipate pt safe to DC home once medically cleared, will continue to follow acutely.   Follow Up Recommendations  Home health PT     Equipment Recommendations  Rolling walker with 5" wheels;3in1 (PT)    Recommendations for Other Services       Precautions / Restrictions Precautions Precautions: Fall;Other (comment) Precaution Comments: s/p abdominal surgery Restrictions Weight Bearing Restrictions: No    Mobility  Bed Mobility Overal bed mobility: Needs Assistance Bed Mobility: Rolling;Sidelying to Sit;Sit to Sidelying Rolling: Modified independent (Device/Increase time) Sidelying to sit: Supervision     Sit to sidelying: Supervision General bed mobility comments: pt performed with min cues but no physical assist needed, good carryover of instruction from previous day's session  Transfers Overall transfer level: Needs assistance Equipment used: Rolling walker (2 wheeled) Transfers: Sit to/from Stand Sit to Stand: Supervision         General transfer comment: from chair and EOB heights, cues for hand placement needed when standing and prior to sitting  Ambulation/Gait Ambulation/Gait assistance:  Supervision Gait Distance (Feet): 300 Feet Assistive device: Rolling walker (2 wheeled) Gait Pattern/deviations: Step-through pattern;Decreased step length - right;Decreased step length - left;Decreased stride length;Trunk flexed Gait velocity: 0.3 m/s Gait velocity interpretation: <1.31 ft/sec, indicative of household ambulator General Gait Details: slow but steady with RW, cues for upright posture and gaze, otherwise no physical assist given   Stairs             Wheelchair Mobility    Modified Rankin (Stroke Patients Only)       Balance Overall balance assessment: Mild deficits observed, not formally tested                                          Cognition Arousal/Alertness: Awake/alert Behavior During Therapy: WFL for tasks assessed/performed Overall Cognitive Status: Within Functional Limits for tasks assessed                                 General Comments: cooperative, somewhat flat affect      Exercises      General Comments        Pertinent Vitals/Pain Pain Assessment: 0-10 Pain Score: 4  Pain Location: abdominal incisions Pain Descriptors / Indicators: Aching;Sore;Operative site guarding Pain Intervention(s): Monitored during session;Repositioned    Home Living                      Prior Function            PT Goals (current goals can now be found in the care plan section) Acute  Rehab PT Goals Patient Stated Goal: go home when medically ready PT Goal Formulation: With patient Time For Goal Achievement: 07/14/20 Potential to Achieve Goals: Good Progress towards PT goals: Progressing toward goals    Frequency    Min 3X/week      PT Plan Current plan remains appropriate    Co-evaluation              AM-PAC PT "6 Clicks" Mobility   Outcome Measure  Help needed turning from your back to your side while in a flat bed without using bedrails?: None Help needed moving from lying on your  back to sitting on the side of a flat bed without using bedrails?: A Little Help needed moving to and from a bed to a chair (including a wheelchair)?: A Little Help needed standing up from a chair using your arms (e.g., wheelchair or bedside chair)?: A Little Help needed to walk in hospital room?: A Little Help needed climbing 3-5 steps with a railing? : A Little 6 Click Score: 19    End of Session Equipment Utilized During Treatment: Gait belt Activity Tolerance: Patient tolerated treatment well Patient left: in chair;with call bell/phone within reach Nurse Communication: Mobility status PT Visit Diagnosis: Difficulty in walking, not elsewhere classified (R26.2);Muscle weakness (generalized) (M62.81);Pain Pain - part of body:  (abdomen)     Time: 1043-1100 PT Time Calculation (min) (ACUTE ONLY): 17 min  Charges:  $Gait Training: 8-22 mins                     Abhijay Morriss P., PTA Acute Rehabilitation Services Pager: [phone] Office: [place] 07/02/2020, 11:12 AM

## 2020-07-02 NOTE — Discharge Instructions (Addendum)
[facility] [phone]  OPEN ABDOMINAL SURGERY: POST OP INSTRUCTIONS  Always review your discharge instruction sheet given to you by the facility where your surgery was performed.  IF YOU HAVE DISABILITY OR FAMILY LEAVE FORMS, YOU MUST BRING THEM TO THE OFFICE FOR PROCESSING.  PLEASE DO NOT GIVE THEM TO YOUR DOCTOR.  1. A prescription for pain medication may be given to you upon discharge.  Take your pain medication as prescribed, if needed.  If narcotic pain medicine is not needed, then you may take acetaminophen (Tylenol) or ibuprofen (Advil) as needed. 2. Take your usually prescribed medications unless otherwise directed. 3. If you need a refill on your pain medication, please contact your pharmacy. They will contact our office to request authorization.  Prescriptions will not be filled after 5pm or on week-ends. 4. You should follow a light diet the first few days after arrival home, such as soup and crackers, pudding, etc.unless your doctor has advised otherwise. A high-fiber, low fat diet can be resumed as tolerated.   Be sure to include lots of fluids daily. Most patients will experience some swelling and bruising on the chest and neck area.  Ice packs will help.  Swelling and bruising can take several days to resolve 5. Most patients will experience some swelling and bruising in the area of the incision. Ice pack will help. Swelling and bruising can take several days to resolve..  6. It is common to experience some constipation if taking pain medication after surgery.  Increasing fluid intake and taking a stool softener will usually help or prevent this problem from occurring.  A mild laxative (Milk of Magnesia or Miralax) should be taken according to package directions if there are no bowel movements after 48 hours. 7.  You may have steri-strips (small skin tapes) in place directly over the incision.  These strips should be left on the skin for 7-10 days.  If your  surgeon used skin glue on the incision, you may shower in 24 hours.  The glue will flake off over the next 2-3 weeks.  Any sutures or staples will be removed at the office during your follow-up visit. You may find that a light gauze bandage over your incision may keep your staples from being rubbed or pulled. You may shower and replace the bandage daily. 8. ACTIVITIES:  You may resume regular (light) daily activities beginning the next day--such as daily self-care, walking, climbing stairs--gradually increasing activities as tolerated.  You may have sexual intercourse when it is comfortable.  Refrain from any heavy lifting or straining until approved by your doctor. a. You may drive when you no longer are taking prescription pain medication, you can comfortably wear a seatbelt, and you can safely maneuver your car and apply brakes 9. You should see your doctor in the office for a follow-up appointment approximately two weeks after your surgery.  Make sure that you call for this appointment within a day or two after you arrive home to insure a convenient appointment time. OTHER INSTRUCTIONS:  _____________________________________________________________ _____________________________________________________________  WHEN TO CALL YOUR DOCTOR: 1. Fever over 101.0 2. Inability to urinate 3. Nausea and/or vomiting 4. Extreme swelling or bruising 5. Continued bleeding from incision. 6. Increased pain, redness, or drainage from the incision. 7. Difficulty swallowing or breathing 8. Muscle cramping or spasms. 9. Numbness or tingling in hands or feet or around lips.  The clinic staff is available to answer your questions during regular   business hours.  Please don't hesitate to call and ask to speak to one of the nurses if you have concerns.  For further questions, please visit [email or web address]    

## 2020-07-02 NOTE — Progress Notes (Signed)
Pt requested to not wear CPAP tonight. States she does not like it wear it. Pt will call RT if needed.

## 2020-07-02 NOTE — Progress Notes (Signed)
[facility] Progress Note  3 Days Post-Op  Subjective: CC-  Feeling well this morning. Abdomen less sore. Tolerating clear liquids. Denies n/v. Has passed gas a few times. No BM.  Objective: Vital signs in last 24 hours: Temp:  [98.1 F (36.7 C)-98.4 F (36.9 C)] 98.2 F (36.8 C) (01/27 0404) Pulse Rate:  [75-90] 75 (01/27 0404) Resp:  [18-20] 20 (01/27 0404) BP: (112-143)/(69-92) 112/69 (01/27 0404) SpO2:  [95 %-100 %] 96 % (01/27 0404) Last BM Date: 06/25/20  Intake/Output from previous day: 01/26 0701 - 01/27 0700 In: 1177.1 [P.O.:610; I.V.:467.1; IV Piggyback:100] Out: 600 [Urine:600] Intake/Output this shift: Total I/O In: 975.2 [I.V.:975.2] Out: -   PE: Gen: Alert, NAD, pleasant Pulm: rate and effort normal Abd: Soft,mild distension, appropriately tender,fewBS heard,honeycomb dressing to midline incision, lap incisions cdi Skin: warm and dry   Lab Results:  Recent Labs    07/01/20 0026 07/02/20 0218  WBC 8.2 5.2  HGB 10.4* 10.8*  HCT 33.5* 32.7*  PLT 165 PLATELET CLUMPS NOTED ON SMEAR, UNABLE TO ESTIMATE   BMET Recent Labs    07/01/20 0026 07/02/20 0218  NA 137 139  K 3.6 3.4*  CL 103 104  CO2 24 23  GLUCOSE 107* 100*  BUN 6* <5*  CREATININE 0.63 0.63  CALCIUM 8.7* 8.6*   PT/INR No results for input(s): LABPROT, INR in the last 72 hours. CMP     Component Value Date/Time   NA 139 07/02/2020 0218   K 3.4 (L) 07/02/2020 0218   CL 104 07/02/2020 0218   CO2 23 07/02/2020 0218   GLUCOSE 100 (H) 07/02/2020 0218   BUN <5 (L) 07/02/2020 0218   CREATININE 0.63 07/02/2020 0218   CALCIUM 8.6 (L) 07/02/2020 0218   PROT <3.0 (L) 06/27/2020 1755   ALBUMIN 3.9 06/27/2020 1755   AST 18 06/27/2020 1755   ALT 14 06/27/2020 1755   ALKPHOS 64 06/27/2020 1755   BILITOT 0.7 06/27/2020 1755   GFRNONAA >60 07/02/2020 0218   GFRAA >60 10/29/2016 1032   Lipase     Component Value Date/Time   LIPASE 23 06/27/2020 1755        Studies/Results: No results found.  Anti-infectives: Anti-infectives (From admission, onward)   Start     Dose/Rate Route Frequency Ordered Stop   06/30/20 0600  cefoTEtan (CEFOTAN) 1 g in sodium chloride 0.9 % 100 mL IVPB        1 g 200 mL/hr over 30 Minutes Intravenous On call to O.R. 06/29/20 1332 06/29/20 1536       Assessment/Plan Type 2 diabetes Anemia- Hgb 10.8 from 10.4, stable Sleep apnea  SBO, small bowel mass S/pDiagnostic laparoscopy, laparoscopic assisted small bowel resection1/24 Dr. Kae Heller -POD#3 - surgical path pending. - Bowel function starting to return. Advance to full liquids. Replete K. Continue mobilizing.  ID -cefotetan periop FEN -KVO IVF, FLD VTE -SCDs, lovenox Foley -d/c 1/25 Follow up -Dr. Kae Heller   LOS: 4 days    Wellington Hampshire, [facility] 07/02/2020, 7:58 AM Please see Amion for pager number during day hours 7:00am-4:30pm

## 2020-07-02 NOTE — Care Management Important Message (Signed)
Important Message  Patient Details  Name: SMT. LODER MRN: [id number] Date of Birth: 1951/01/07   Medicare Important Message Given:  Yes - Important Message mailed due to current National Emergency   Verbal consent obtained due to current National Emergency  Relationship to patient: Self Contact Name: Kesha Hurrell Call Date: 07/02/20  Time: 1512 Phone: [phone] Outcome: No Answer/Busy Important Message mailed to: Patient address on file    Delorse Lek 07/02/2020, 3:12 PM

## 2020-07-03 LAB — BASIC METABOLIC PANEL
Anion gap: 9 (ref 5–15)
BUN: <5 mg/dL — ABNORMAL LOW (ref 8–23)
CO2: 23 mmol/L (ref 22–32)
Calcium: 8.5 mg/dL — ABNORMAL LOW (ref 8.9–10.3)
Chloride: 107 mmol/L (ref 98–111)
Creatinine, Ser: 0.6 mg/dL (ref 0.44–1.00)
GFR, Estimated: >60 mL/min (ref >60)
Glucose, Bld: 124 mg/dL — ABNORMAL HIGH (ref 70–99)
Potassium: 3.8 mmol/L (ref 3.5–5.1)
Sodium: 139 mmol/L (ref 135–145)

## 2020-07-03 LAB — GLUCOSE, CAPILLARY
Glucose-Capillary: 128 mg/dL — ABNORMAL HIGH (ref 70–99)
Glucose-Capillary: 129 mg/dL — ABNORMAL HIGH (ref 70–99)
Glucose-Capillary: 130 mg/dL — ABNORMAL HIGH (ref 70–99)
Glucose-Capillary: 247 mg/dL — ABNORMAL HIGH (ref 70–99)

## 2020-07-03 LAB — CBC
HCT: 34.5 % — ABNORMAL LOW (ref 36.0–46.0)
Hemoglobin: 10.5 g/dL — ABNORMAL LOW (ref 12.0–15.0)
MCH: 27.9 pg (ref 26.0–34.0)
MCHC: 30.4 g/dL (ref 30.0–36.0)
MCV: 91.5 fL (ref 80.0–100.0)
Platelets: 166 10*3/uL (ref 150–400)
RBC: 3.77 MIL/uL — ABNORMAL LOW (ref 3.87–5.11)
RDW: 15.5 % (ref 11.5–15.5)
WBC: 4.9 10*3/uL (ref 4.0–10.5)
nRBC: 0 % (ref 0.0–0.2)

## 2020-07-03 LAB — MAGNESIUM: Magnesium: 1.8 mg/dL (ref 1.7–2.4)

## 2020-07-03 LAB — SURGICAL PATHOLOGY

## 2020-07-03 MED ORDER — ACETAMINOPHEN 325 MG PO TABS: 650.0000 mg | ORAL_TABLET | Freq: Four times a day (QID) | ORAL | Status: AC | PRN

## 2020-07-03 MED ORDER — POLYETHYLENE GLYCOL 3350 17 G PO PACK: 17.0000 g | PACK | Freq: Every day | ORAL | 0 refills | Status: AC | PRN

## 2020-07-03 MED ORDER — OXYCODONE HCL 5 MG PO TABS: 5.0000 mg | ORAL_TABLET | Freq: Four times a day (QID) | ORAL | 0 refills | Status: AC | PRN

## 2020-07-03 NOTE — Discharge Summary (Signed)
Name: Abigail Houston MRN: [id number] DOB: [birth year] 70 y.o. PCP: Bernerd Limbo, MD  Date of Admission: 06/27/2020  5:27 PM Date of Discharge: 07/04/20 Attending Physician: Dr. Angelia Mould  Discharge Diagnosis: Principal Problem:   SBO (small bowel obstruction) ([facility]) Active Problems:   Obesity (BMI 35.0-39.9 without comorbidity)   Type 2 diabetes mellitus with diabetic mononeuropathy, without long-term current use of insulin (HCC)    Subjective:  Patient states she is doing well this morning. She endorses eating her meals yesterday, and continues to pass gas. She denies nausea, vomiting, or stomach pain.   Physical Exam Constitutional:      General: She is not in acute distress.    Appearance: She is well-developed. She is not ill-appearing, toxic-appearing or diaphoretic.  Cardiovascular:     Rate and Rhythm: Normal rate and regular rhythm.     Pulses: Normal pulses.     Heart sounds: Normal heart sounds.  Pulmonary:     Effort: Pulmonary effort is normal.     Breath sounds: Normal breath sounds. No wheezing, rhonchi or rales.  Abdominal:     General: Abdomen is flat. Bowel sounds are normal.     Palpations: Abdomen is soft.     Tenderness: There is no abdominal tenderness. There is no guarding.  Neurological:     Mental Status: She is alert.  Psychiatric:        Mood and Affect: Mood normal.        Behavior: Behavior normal.     Discharge Medications: Allergies as of 07/04/2020      Reactions   Motrin [ibuprofen] Hives   hives   Pravastatin Other (See Comments)   Leg cramps   Ampicillin Hives   Penicillins Hives, Swelling      Medication List    TAKE these medications   acetaminophen 325 MG tablet Commonly known as: TYLENOL Take 2 tablets (650 mg total) by mouth every 6 (six) hours as needed for fever or mild pain.   aspirin EC 81 MG tablet Take 81 mg by mouth daily.   CALCIUM PO Take 1 tablet by mouth daily.   CENTRUM SILVER PO Take 1 tablet by  mouth daily.   cholecalciferol 1000 units tablet Commonly known as: VITAMIN D Take 1,000 Units by mouth daily.   ezetimibe 10 MG tablet Commonly known as: ZETIA Take 10 mg by mouth at bedtime.   ferrous sulfate 325 (65 FE) MG tablet Take 325 mg by mouth daily with breakfast.   Fish Oil 1000 MG Caps Take 1 capsule by mouth daily.   furosemide 20 MG tablet Commonly known as: LASIX Take 20 mg by mouth daily as needed for fluid or edema.   metFORMIN 500 MG tablet Commonly known as: GLUCOPHAGE Take 500 mg by mouth in the morning and at bedtime.   ondansetron 8 MG disintegrating tablet Commonly known as: ZOFRAN-ODT Take 8 mg by mouth every 8 (eight) hours as needed for nausea or vomiting.   oxyCODONE 5 MG immediate release tablet Commonly known as: Oxy IR/ROXICODONE Take 1 tablet (5 mg total) by mouth every 6 (six) hours as needed for severe pain.   polyethylene glycol 17 g packet Commonly known as: MiraLax Take 17 g by mouth daily as needed for mild constipation.   vitamin B-12 1000 MCG tablet Commonly known as: CYANOCOBALAMIN Take 1,000 mcg by mouth daily.   vitamin C 500 MG tablet Commonly known as: ASCORBIC ACID Take 500 mg by mouth daily.   VITAMIN  E PO Take 1 capsule by mouth daily.   zinc gluconate 50 MG tablet Take 50 mg by mouth daily.            Durable Medical Equipment  (From admission, onward)         Start     Ordered   07/01/20 0731  For home use only DME 3 n 1  Once        07/01/20 0732   07/01/20 0731  For home use only DME Walker rolling  Once       Question Answer Comment  Walker: With 5 Inch Wheels   Patient needs a walker to treat with the following condition Status post exploratory laparotomy      07/01/20 0732           Discharge Care Instructions  (From admission, onward)         Start     Ordered   07/04/20 0000  Leave dressing on - Keep it clean, dry, and intact until clinic visit        07/04/20 1234           Disposition and follow-up:   Abigail Houston was discharged from [facility] in Stable condition.  At the hospital follow up visit please address:  1.  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  Labs / imaging needed at time of follow-up: None  3.  Pending labs/ test needing follow-up: Surgical Pathology Report 4.  Medication Changes  Started: Oxycodone, Miralax, Acetaminophen   Stopped: None  Abx - None  Follow-up Appointments:  Follow-up Information    [facility] Follow up.   Specialty: Home Health Services Why: Physical Therapy- Aide-office to call with visit times.  Contact information: [address] [address] [phone]        [facility] Follow up.   Why: Rolling Walker and Bedside commode to be delivered to the room prior to transition home.  Contact information: [address] [place] [address] [phone]        Clovis Riley, MD. Go on 07/17/2020.   Specialty: General Surgery Why: Your appointment is 07/17/20 at 1:50pm Please arrove 30 minutes prior to your appointment to check in and fill out paperwork. Bring photo ID and insurance information. Contact information: [address] [place] [address] [phone]        Bernerd Limbo, MD. Schedule an appointment as soon as possible for a visit.   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 CT Imaging was performed revealed a small bowel obstruction. Vaginal [place] was performed, revealing a 6.9 cm soft tissue mass in the area of the bowel obstruction. She did not require NG  decompression, as she was without active n/v. General surgery were consulted and the patient was taken to the OR for removal of the mass, a small bowel anastomosis was done. 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 She will follow up  with general surgery and oncology.    Pertinent Labs, Studies, and Procedures:  CBC Latest Ref Rng & Units 07/03/2020 07/02/2020 07/01/2020  WBC 4.0 - 10.5 K/uL 4.9 5.2 8.2  Hemoglobin 12.0 - 15.0 g/dL 10.5(L) 10.8(L) 10.4(L)  Hematocrit 36.0 - 46.0 % 34.5(L) 32.7(L) 33.5(L)  Platelets 150 - 400 K/uL 166 PLATELET CLUMPS NOTED ON SMEAR, UNABLE TO ESTIMATE 165    CMP Latest Ref Rng & Units 07/03/2020 07/02/2020 07/01/2020  Glucose 70 - 99 mg/dL 124(H) 100(H) 107(H)  BUN 8 - 23 mg/dL <5(L) <5(L) 6(L)  Creatinine 0.44 - 1.00 mg/dL 0.60 0.63 0.63  Sodium 135 - 145 mmol/L 139 139 137  Potassium 3.5 - 5.1 mmol/L 3.8 3.4(L) 3.6  Chloride 98 - 111 mmol/L 107 104 103  CO2 22 - 32 mmol/L 23 23 24   Calcium 8.9 - 10.3 mg/dL 8.5(L) 8.6(L) 8.7(L)  Total Protein 6.5 - 8.1 g/dL - - -  Total Bilirubin 0.3 - 1.2 mg/dL - - -  Alkaline Phos 38 - 126 U/L - - -  AST 15 - 41 U/L - - -  ALT 0 - 44 U/L - - -    CT ABDOMEN PELVIS W CONTRAST  Result Date: 06/28/2020 CLINICAL DATA:  Epigastric pain and suprapubic pain. EXAM: CT ABDOMEN AND PELVIS WITH CONTRAST TECHNIQUE: Multidetector CT imaging of the abdomen and pelvis was performed using the standard protocol following bolus administration of intravenous contrast. CONTRAST:  113mL OMNIPAQUE IOHEXOL 300 MG/ML  SOLN COMPARISON:  None. FINDINGS: Lower chest: Lung bases are clear. Hepatobiliary: No focal liver abnormality is seen. No gallstones, gallbladder wall thickening, or biliary dilatation. Pancreas: Unremarkable. No pancreatic ductal dilatation or surrounding inflammatory changes. Spleen: Normal in size without  focal abnormality. Adrenals/Urinary Tract: Adrenal glands are unremarkable. Kidneys are normal, without renal calculi, focal lesion, or hydronephrosis. Bladder is unremarkable. Stomach/Bowel: Stomach is unremarkable. Dilated fluid-filled small bowel with mildly thickened wall. Decompressed terminal ileum with transition zone in the left abdomen. Mild mesenteric edema. Changes likely represent small bowel obstruction. Enteritis may also be present. Colon is decompressed with scattered colonic diverticula. No evidence of diverticulitis. Appendix is normal. Vascular/Lymphatic: Prominent mesenteric lymphadenopathy with anterior pericaval lymphadenopathy. Pericaval node measures 2.8 cm short axis dimension. Mesenteric nodes measure up to 3.1 cm short axis dimension. Appearances are suspicious for lymphoma. Metastasis less likely. Reproductive: Endometrial fluid and thickening out of proportion to the patient's age. Suggestion of a right adnexal mass. Free fluid in the pelvis. Pelvic ultrasound is suggested for further characterization. Other: No free air.  Abdominal wall musculature appears intact. Musculoskeletal: No acute or significant osseous findings. IMPRESSION: 1. Small bowel obstruction with wall thickening and mesenteric edema, possibly also enteritis. 2. Prominent mesenteric and anterior pericaval lymphadenopathy. Appearances are suspicious for lymphoma. Metastasis less likely. 3. Endometrial fluid and thickening out of proportion to the patient's age. Right adnexal mass. Pelvic ultrasound is suggested for further characterization. 4. Free fluid in the pelvis. Electronically Signed   By: Lucienne Capers M.D.   On: 06/28/2020 01:13   US PELVIC COMPLETE WITH TRANSVAGINAL  Result Date: 06/28/2020 CLINICAL DATA:  Patient with recent CT and possible pelvic mass. EXAM: TRANSABDOMINAL AND TRANSVAGINAL ULTRASOUND OF PELVIS TECHNIQUE: Both transabdominal and transvaginal ultrasound examinations of the pelvis were  performed. Transabdominal technique was performed for global imaging of the pelvis including uterus, ovaries, adnexal regions, and pelvic cul-de-sac. It was necessary to proceed with endovaginal exam following the transabdominal exam to visualize the adnexal structures. COMPARISON:  CT abdomen  pelvis 11/26/2020 FINDINGS: Uterus Measurements: 5.3 x 2.8 x 4.3 cm = volume: 33.5 mL. No fibroids or other mass visualized. Endometrium Thickness: 9 mm. The endometrium is thickened. There is fluid and heterogeneous soft tissue within the endometrial canal. Cannot exclude endometrial mass. Right ovary No definite right ovarian tissue is visualized. Left ovary Not visualized. Other findings Within the right adnexa there is a 6.9 x 2.9 x 5.7 cm heterogeneous soft tissue mass. This conglomerate mass appears to be a soft tissue mass involving a small bowel loop within the right hemipelvis. There is a small amount of surrounding fluid. IMPRESSION: 1. There is a 6.9 cm soft tissue mass within the right adnexa which appears to be a soft tissue mass associated with a loop of small bowel within the right lower abdomen. This appears to be located at the point of transition of the small bowel obstruction. Findings are concerning for possible small bowel lymphoma, carcinoid or small bowel adenocarcinoma. 2. The endometrium is thickened with a small amount of fluid and heterogeneous soft tissue. Cannot exclude underlying endometrial mass. Recommend non urgent gyn consultation and endometrial biopsy. 3. These results were called by telephone at the time of interpretation on 06/28/2020 at 10:23 am to provider Dr. Sharon Seller, who verbally acknowledged these results. Electronically Signed   By: Lovey Newcomer M.D.   On: 06/28/2020 10:52     Discharge Instructions    Diet - low sodium heart healthy   Complete by: As directed    Increase activity slowly   Complete by: As directed    Leave dressing on - Keep it clean, dry, and intact until  clinic visit   Complete by: As directed     Signed: Maudie Mercury, MD 07/04/2020, 12:35 PM   Pager: [phone]

## 2020-07-03 NOTE — Progress Notes (Signed)
[facility] Progress Note  4 Days Post-Op  Subjective: CC-  Feeling better today. She reports minimal abdominal pain. Tolerating full liquids. Denies n/v. Small BM yesterday. Continues to pass flatus.  Objective: Vital signs in last 24 hours: Temp:  [98.1 F (36.7 C)-99.7 F (37.6 C)] 98.1 F (36.7 C) (01/28 0616) Pulse Rate:  [77-94] 77 (01/28 0616) Resp:  [16-17] 17 (01/28 0616) BP: (115-140)/(71-91) 128/75 (01/28 0616) SpO2:  [98 %-99 %] 98 % (01/28 0616) Last BM Date: 07/02/20  Intake/Output from previous day: 01/27 0701 - 01/28 0700 In: 1665.2 [P.O.:600; I.V.:1065.2] Out: -  Intake/Output this shift: No intake/output data recorded.  PE: Gen: Alert, NAD, pleasant Pulm: rate and effort normal Abd: Soft,mild distension, appropriately tender, +BS,midline and lap incisions cdi without erythema or drainage Skin: warm and dry   Lab Results:  Recent Labs    07/02/20 0218 07/03/20 0052  WBC 5.2 4.9  HGB 10.8* 10.5*  HCT 32.7* 34.5*  PLT PLATELET CLUMPS NOTED ON SMEAR, UNABLE TO ESTIMATE 166   BMET Recent Labs    07/02/20 0218 07/03/20 0052  NA 139 139  K 3.4* 3.8  CL 104 107  CO2 23 23  GLUCOSE 100* 124*  BUN <5* <5*  CREATININE 0.63 0.60  CALCIUM 8.6* 8.5*   PT/INR No results for input(s): LABPROT, INR in the last 72 hours. CMP     Component Value Date/Time   NA 139 07/03/2020 0052   K 3.8 07/03/2020 0052   CL 107 07/03/2020 0052   CO2 23 07/03/2020 0052   GLUCOSE 124 (H) 07/03/2020 0052   BUN <5 (L) 07/03/2020 0052   CREATININE 0.60 07/03/2020 0052   CALCIUM 8.5 (L) 07/03/2020 0052   PROT <3.0 (L) 06/27/2020 1755   ALBUMIN 3.9 06/27/2020 1755   AST 18 06/27/2020 1755   ALT 14 06/27/2020 1755   ALKPHOS 64 06/27/2020 1755   BILITOT 0.7 06/27/2020 1755   GFRNONAA >60 07/03/2020 0052   GFRAA >60 10/29/2016 1032   Lipase     Component Value Date/Time   LIPASE 23 06/27/2020 1755       Studies/Results: No results  found.  Anti-infectives: Anti-infectives (From admission, onward)   Start     Dose/Rate Route Frequency Ordered Stop   06/30/20 0600  cefoTEtan (CEFOTAN) 1 g in sodium chloride 0.9 % 100 mL IVPB        1 g 200 mL/hr over 30 Minutes Intravenous On call to O.R. 06/29/20 1332 06/29/20 1536       Assessment/Plan Type 2 diabetes Anemia- Hgb10.5, stable Sleep apnea  SBO, small bowel mass S/pDiagnostic laparoscopy, laparoscopic assisted small bowel resection1/24 Dr. Kae Heller -POD#4 - surgical path pending, I spoke with pathology who said they should be putting an update in the chart today - Advance to soft diet. If she tolerates this then ok to discharge this afternoon from surgical standpoint. I will send rx for oxy to her pharmacy. Discharge instructions and follow up info on AVS.  ID -cefotetan periop FEN - KVO IVF,soft diet VTE -SCDs, lovenox Foley -d/c 1/25 Follow up -Dr. Kae Heller    LOS: 5 days    Wellington Hampshire, [facility] Surgery 07/03/2020, 8:30 AM Please see Amion for pager number during day hours 7:00am-4:30pm

## 2020-07-03 NOTE — Progress Notes (Signed)
   Subjective:   No acute events overnight.  Patient post-op day 4  During evaluation this morning, reports she had a BM yesterday. Was able to tolerate potato soup yesterday without nausea/vomiting. Urinating without issue. States she is ready to go home today.  Objective:  Vital signs in last 24 hours: Vitals:   07/02/20 0404 07/02/20 1454 07/02/20 2025 07/03/20 0616  BP: 112/69 (!) 140/91 115/71 128/75  Pulse: 75 94 83 77  Resp: 20 17 16 17   Temp: 98.2 F (36.8 C) 98.2 F (36.8 C) 99.7 F (37.6 C) 98.1 F (36.7 C)  TempSrc: Oral Oral Oral Oral  SpO2: 96% 98% 99% 98%  Weight:      Height:       Constitutional: well-appearing, resting comfortably in bed.  Cardiovascular: regular rate and rhythm Pulmonary/Chest: normal work of breathing on room air MSK: normal bulk and tone Neurological: alert & oriented x 3 Skin: warm and dry Psych: Calm mood   CBC Latest Ref Rng & Units 07/03/2020 07/02/2020 07/01/2020  WBC 4.0 - 10.5 K/uL 4.9 5.2 8.2  Hemoglobin 12.0 - 15.0 g/dL 10.5(L) 10.8(L) 10.4(L)  Hematocrit 36.0 - 46.0 % 34.5(L) 32.7(L) 33.5(L)  Platelets 150 - 400 K/uL 166 PLATELET CLUMPS NOTED ON SMEAR, UNABLE TO ESTIMATE 165   BMP Latest Ref Rng & Units 07/03/2020 07/02/2020 07/01/2020  Glucose 70 - 99 mg/dL 124(H) 100(H) 107(H)  BUN 8 - 23 mg/dL <5(L) <5(L) 6(L)  Creatinine 0.44 - 1.00 mg/dL 0.60 0.63 0.63  Sodium 135 - 145 mmol/L 139 139 137  Potassium 3.5 - 5.1 mmol/L 3.8 3.4(L) 3.6  Chloride 98 - 111 mmol/L 107 104 103  CO2 22 - 32 mmol/L 23 23 24   Calcium 8.9 - 10.3 mg/dL 8.5(L) 8.6(L) 8.7(L)     Assessment/Plan:  Ms. Lousie Calico is a 70 yo female with PMH of arthritis, IDA, T2DM, and sleep apnea who present presented for evaluation of worsening abdominal pain, nausea and vomiting and found to have small bowel obstruction.   #Small bowel obstruction 2/2 Right adnexal mass #S/P Small Bowel Resection Day 4 Tolerating diet well and had BM yesterday. If able to  tolerate soft diet, okay to discharge per gen surg. Denies N/V. Pending pathology of mass.  -soft diets -IV LR at 10 mL/hr, will continue until patient increases oral intake -IV Dilaudid 0.5-1 mg q2h prn for pain -Zofran 4 mg q6h prn for nausea -daily BMP  #Hypokalemia K of 3.8.  -40 meq klorcon powder BID  #T2DM --CBG monitoring q8h in setting of NPO status --D5 if patient becomes hypoglycemic  #OSA Refused CPAP last night -cpap ordered  #Iron deficiency anemia Patient has a history of iron deficiency anemia on supplementation with ferrous sulfate 325 mg daily. Hemoglobin currently normal at 10.5. Continue to monitor closely. -daily CBC -transfuse if hgb under 7  Diet: Clears, boost supplement VTE: Lovenox Fluids: LR 10 continuous Code Status: Full Code PT/OT: Home Health  Anticipate Discharge: Home 0-1 days pending her tolerating soft diet.  [place]  Internal Medicine Resident PGY-1 [place]  Pager: [phone]

## 2020-07-04 LAB — GLUCOSE, CAPILLARY: Glucose-Capillary: 130 mg/dL — ABNORMAL HIGH (ref 70–99)

## 2020-07-04 NOTE — Progress Notes (Signed)
Progress Note: General Surgery Service   Chief Complaint/Subjective: Tolerating diet, +flatus, ambulating well  Objective: Vital signs in last 24 hours: Temp:  [98.6 F (37 C)-98.9 F (37.2 C)] 98.9 F (37.2 C) (01/28 2009) Pulse Rate:  [73-83] 75 (01/29 0500) Resp:  [16-19] 16 (01/29 0500) BP: (108-134)/(66-73) 124/70 (01/29 0500) SpO2:  [99 %-100 %] 100 % (01/29 0500) Last BM Date: 07/02/20  Intake/Output from previous day: 01/28 0701 - 01/29 0700 In: 180 [P.O.:180] Out: -  Intake/Output this shift: No intake/output data recorded.  Gen: NAD  Resp: nonlabored  Card: RRR  Abd: incisions c/d/i  Lab Results: CBC  Recent Labs    07/02/20 0218 07/03/20 0052  WBC 5.2 4.9  HGB 10.8* 10.5*  HCT 32.7* 34.5*  PLT PLATELET CLUMPS NOTED ON SMEAR, UNABLE TO ESTIMATE 166   BMET Recent Labs    07/02/20 0218 07/03/20 0052  NA 139 139  K 3.4* 3.8  CL 104 107  CO2 23 23  GLUCOSE 100* 124*  BUN <5* <5*  CREATININE 0.63 0.60  CALCIUM 8.6* 8.5*   PT/INR No results for input(s): LABPROT, INR in the last 72 hours. ABG No results for input(s): PHART, HCO3 in the last 72 hours.  Invalid input(s): PCO2, PO2  Anti-infectives: Anti-infectives (From admission, onward)   Start     Dose/Rate Route Frequency Ordered Stop   06/30/20 0600  cefoTEtan (CEFOTAN) 1 g in sodium chloride 0.9 % 100 mL IVPB        1 g 200 mL/hr over 30 Minutes Intravenous On call to O.R. 06/29/20 1332 06/29/20 1536      Medications: Scheduled Meds: . enoxaparin (LOVENOX) injection  40 mg Subcutaneous Q24H  . feeding supplement  1 Container Oral TID BM   Continuous Infusions: . lactated ringers Stopped (07/02/20 0938)  . methocarbamol (ROBAXIN) IV Stopped (06/30/20 2307)   PRN Meds:.acetaminophen, HYDROmorphone (DILAUDID) injection, methocarbamol (ROBAXIN) IV, ondansetron **OR** ondansetron (ZOFRAN) IV, oxyCODONE  Assessment/Plan: s/p Procedure(s): LAPAROSCOPY DIAGNOSTIC, SMALL  BOWEL  RESECTION EXPLORATORY LAPAROTOMY 06/29/2020  SBO, small bowel mass S/pDiagnostic laparoscopy, laparoscopic assisted small bowel resection1/24 Dr. Kae Heller -POD#5 - path consistent with t cell lymphoma, discussed findings with oncology who will arrange for outpatient consult -tolerating soft diet. Ok to discharge from surgery standpoint  ID -cefotetan periop FEN - KVOIVF,soft diet VTE -SCDs, lovenox Foley -d/c 1/25 Follow up -Dr. Kae Heller    LOS: 6 days   Mickeal Skinner, MD [facility]

## 2020-07-04 NOTE — Progress Notes (Signed)
Discharge Home. Home discharge instruction given, no question verbalized. 

## 2020-07-04 NOTE — Plan of Care (Signed)

## 2020-07-04 NOTE — TOC Transition Note (Signed)
Transition of Care [facility]) - CM/SW Discharge Note   Patient Details  Name: Abigail Houston MRN: [id number] Date of Birth: July 25, 1950  Transition of Care [facility]) CM/SW Contact:  Sharin Mons, RN Phone Number: 07/04/2020, 1:49 PM   Clinical Narrative:    Patient will DC to: home  Anticipated DC date: 07/04/2020 Family notified: yes, [facility] by: car  Admitted with abd pain ... s/p laparoscopic assisted small bowel resection,1/24.  Per MD patient ready for DC today . RN, patient, patient's family, and [facility] notified of DC. Pt without any Rx med concerns or affordability issues. Post hospital f/u noted on AVS.  [name] (Daughter)      [phone]       RNCM will sign off for now as intervention is no longer needed. Please consult [place] again if new needs arise.    Final next level of care: [place] Barriers to Discharge: No Barriers Identified   Patient Goals and CMS Choice Patient states their goals for this hospitalization and ongoing recovery are:: to return home- independent prior to [facility]   Choice offered to / list presented to :  ([place] discussed with family that calls would need to be made to agencies to see who is in network.)  Discharge Placement                       Discharge Plan and Services In-house Referral: NA Discharge Planning Services: CM Consult Post Acute Care Choice: [place]          DME Arranged: Bedside commode,Walker rolling DME Agency: [employer] Date DME Agency Contacted: 07/01/20 Time DME Agency Contacted: [phone] Representative spoke with at DME Agency: Freda Munro HH Arranged: Nurse's Aide,PT [place] Agency: [facility] Date [place]: 07/01/20 Time [place]: 1635 Representative spoke with at [place]: [place] ([place]) Interventions     Readmission Risk Interventions No flowsheet data found.

## 2020-07-04 NOTE — Progress Notes (Signed)
Occupational Therapy Treatment + Discharge Patient Details Name: Abigail Houston MRN: [id number] DOB: 08/02/1950 Today's Date: 07/04/2020    History of present illness 70yo female s/p ex lap with small bowel resection done 06/29/20. PMH DM   OT comments  Pt seen for OT treatment and progressing well with therapy. Pt currently able to perform LB ADL with figure 4 technique. Pt transfers with modified independence and has been ambulating to the bathroom with no AD and without assistance. Pt reports "I gave myself a shower yesterday." RN to confirm that. Pt does not require continued OT skilled services as pt appears back to baseline. Education for bringing BLEs up instead of bending over for painless strategy.  Pt does not require continued OT skilled services. All OT goals met. OT signing off. Thank you.    Follow Up Recommendations  No OT follow up    Equipment Recommendations  None recommended by OT    Recommendations for Other Services      Precautions / Restrictions Precautions Precautions: Fall;Other (comment) Precaution Comments: s/p abdominal surgery Restrictions Weight Bearing Restrictions: No       Mobility Bed Mobility               General bed mobility comments: pt in recliner pre an post session  Transfers Overall transfer level: Modified independent     Sit to Stand: Modified independent (Device/Increase time)         General transfer comment: No LOB, no cues for safety required    Balance Overall balance assessment: Mild deficits observed, not formally tested                                         ADL either performed or assessed with clinical judgement   ADL Overall ADL's : At baseline                                       General ADL Comments: Pt able to perform LB ADL with figure 4 technique. Pt transfers with modified independence and has been ambulating to the bathroom with no AD and without assistance. Pt  reports "I gave myself a shower yesterday." RN to confirm that.     Vision   Additional Comments: wears glasses at all times   Perception     Praxis      Cognition Arousal/Alertness: Awake/alert Behavior During Therapy: WFL for tasks assessed/performed Overall Cognitive Status: Within Functional Limits for tasks assessed                                          Exercises     Shoulder Instructions       General Comments VSS on RA. Pt reports "I will go home with one of my daughters because they all live in [place]."    Pertinent Vitals/ Pain       Pain Assessment: 0-10 Pain Score: 2  Pain Location: abdominal incisions Pain Descriptors / Indicators: Sore Pain Intervention(s): Monitored during session;Repositioned  Home Living  Prior Functioning/Environment              Frequency  Min 1X/week        Progress Toward Goals  OT Goals(current goals can now be found in the care plan section)  Progress towards OT goals: Goals met/education completed, patient discharged from OT  Acute Rehab OT Goals Patient Stated Goal: go home when medically ready OT Goal Formulation: All assessment and education complete, DC therapy Time For Goal Achievement: 07/15/20 Potential to Achieve Goals: Good ADL Goals Pt Will Perform Lower Body Bathing: with modified independence;sit to/from stand Pt Will Perform Lower Body Dressing: with modified independence;sit to/from stand Pt Will Transfer to Toilet: with modified independence;regular height toilet;ambulating Pt Will Perform Toileting - Clothing Manipulation and hygiene: Independently;sit to/from stand  Plan All goals met and education completed, patient discharged from OT services    Co-evaluation                 AM-PAC OT "6 Clicks" Daily Activity     Outcome Measure   Help from another person eating meals?: None Help from another person  taking care of personal grooming?: None Help from another person toileting, which includes using toliet, bedpan, or urinal?: None Help from another person bathing (including washing, rinsing, drying)?: None Help from another person to put on and taking off regular upper body clothing?: None Help from another person to put on and taking off regular lower body clothing?: None 6 Click Score: 24    End of Session    OT Visit Diagnosis: Pain   Activity Tolerance Patient tolerated treatment well   Patient Left in chair;with call bell/phone within reach   Nurse Communication Mobility status        Time: 3149-7026 OT Time Calculation (min): 12 min  Charges: OT General Charges $OT Visit: 1 Visit OT Treatments $Self Care/Home Management : 8-22 mins  Jefferey Pica, OTR/L Acute Rehabilitation Services Pager: [phone] Office: [phone]    Daegan Arizmendi C 07/04/2020, 1:25 PM

## 2020-07-07 ENCOUNTER — Telehealth: Payer: Self-pay | Admitting: Hematology and Oncology

## 2020-07-07 NOTE — Telephone Encounter (Signed)
I received a scheduling msg to schedule an appt for Abigail Houston who has been diagnosed w/t-cell lymphoma. I cld and scheduled [name] to see Dr. Lorenso Courier on 2/14 at 1pm. I offered an earlier appt date and time but the pt declined because she is currently in [place] w/her children. Pt aware to arrive 20 minutes early.

## 2020-07-07 NOTE — Telephone Encounter (Signed)
Abigail Houston cld to reschedule her appt w/Dr. Chryl Heck to 2/2 at 1pm. Aware to arrive 20 minutes early.

## 2020-07-08 ENCOUNTER — Other Ambulatory Visit: Payer: Self-pay

## 2020-07-08 ENCOUNTER — Encounter: Payer: Self-pay | Admitting: Hematology and Oncology

## 2020-07-08 ENCOUNTER — Inpatient Hospital Stay: Payer: Medicare Other

## 2020-07-08 ENCOUNTER — Inpatient Hospital Stay: Payer: Medicare Other | Attending: Hematology and Oncology | Admitting: Hematology and Oncology

## 2020-07-08 ENCOUNTER — Telehealth: Payer: Self-pay | Admitting: Hematology and Oncology

## 2020-07-08 VITALS — BP 125/74 | HR 80 | Temp 98.6°F | Resp 16 | Ht 66.0 in | Wt 190.4 lb

## 2020-07-08 DIAGNOSIS — Z8041 Family history of malignant neoplasm of ovary: Secondary | ICD-10-CM | POA: Diagnosis not present

## 2020-07-08 DIAGNOSIS — E119 Type 2 diabetes mellitus without complications: Secondary | ICD-10-CM | POA: Insufficient documentation

## 2020-07-08 DIAGNOSIS — Z7984 Long term (current) use of oral hypoglycemic drugs: Secondary | ICD-10-CM | POA: Diagnosis not present

## 2020-07-08 DIAGNOSIS — C8442 Peripheral T-cell lymphoma, not classified, intrathoracic lymph nodes: Secondary | ICD-10-CM | POA: Diagnosis not present

## 2020-07-08 DIAGNOSIS — K56609 Unspecified intestinal obstruction, unspecified as to partial versus complete obstruction: Secondary | ICD-10-CM

## 2020-07-08 DIAGNOSIS — Z79899 Other long term (current) drug therapy: Secondary | ICD-10-CM | POA: Insufficient documentation

## 2020-07-08 DIAGNOSIS — R109 Unspecified abdominal pain: Secondary | ICD-10-CM | POA: Insufficient documentation

## 2020-07-08 LAB — CBC WITH DIFFERENTIAL/PLATELET
Abs Immature Granulocytes: 0.01 10*3/uL (ref 0.00–0.07)
Basophils Absolute: 0.1 10*3/uL (ref 0.0–0.1)
Basophils Relative: 1 %
Eosinophils Absolute: 0.1 10*3/uL (ref 0.0–0.5)
Eosinophils Relative: 1 %
HCT: 37.4 % (ref 36.0–46.0)
Hemoglobin: 11.7 g/dL — ABNORMAL LOW (ref 12.0–15.0)
Immature Granulocytes: 0 %
Lymphocytes Relative: 31 %
Lymphs Abs: 2.1 10*3/uL (ref 0.7–4.0)
MCH: 27.9 pg (ref 26.0–34.0)
MCHC: 31.3 g/dL (ref 30.0–36.0)
MCV: 89.3 fL (ref 80.0–100.0)
Monocytes Absolute: 0.5 10*3/uL (ref 0.1–1.0)
Monocytes Relative: 8 %
Neutro Abs: 4.2 10*3/uL (ref 1.7–7.7)
Neutrophils Relative %: 59 %
Platelets: 217 10*3/uL (ref 150–400)
RBC: 4.19 MIL/uL (ref 3.87–5.11)
RDW: 15.3 % (ref 11.5–15.5)
WBC: 6.9 10*3/uL (ref 4.0–10.5)
nRBC: 0 % (ref 0.0–0.2)

## 2020-07-08 LAB — HEPATITIS PANEL, ACUTE
HCV Ab: NONREACTIVE
Hep A IgM: NONREACTIVE
Hep B C IgM: NONREACTIVE
Hepatitis B Surface Ag: NONREACTIVE

## 2020-07-08 LAB — CMP (CANCER CENTER ONLY)
ALT: 33 U/L (ref 0–44)
AST: 16 U/L (ref 15–41)
Albumin: 3.9 g/dL (ref 3.5–5.0)
Alkaline Phosphatase: 97 U/L (ref 38–126)
Anion gap: 9 (ref 5–15)
BUN: 8 mg/dL (ref 8–23)
CO2: 25 mmol/L (ref 22–32)
Calcium: 9.5 mg/dL (ref 8.9–10.3)
Chloride: 108 mmol/L (ref 98–111)
Creatinine: 0.72 mg/dL (ref 0.44–1.00)
GFR, Estimated: >60 mL/min (ref >60)
Glucose, Bld: 107 mg/dL — ABNORMAL HIGH (ref 70–99)
Potassium: 3.5 mmol/L (ref 3.5–5.1)
Sodium: 142 mmol/L (ref 135–145)
Total Bilirubin: 0.4 mg/dL (ref 0.3–1.2)
Total Protein: 7.9 g/dL (ref 6.5–8.1)

## 2020-07-08 LAB — MAGNESIUM: Magnesium: 1.6 mg/dL — ABNORMAL LOW (ref 1.7–2.4)

## 2020-07-08 LAB — LACTATE DEHYDROGENASE: LDH: 188 U/L (ref 98–192)

## 2020-07-08 LAB — URIC ACID: Uric Acid, Serum: 3.4 mg/dL (ref 2.5–7.1)

## 2020-07-08 LAB — PHOSPHORUS: Phosphorus: 2.7 mg/dL (ref 2.5–4.6)

## 2020-07-08 NOTE — Telephone Encounter (Signed)
Scheduled appointment per 2/2 los. Spoke to patient who is aware of appointment date and time. Patient is aware appointment is a phone visit.

## 2020-07-08 NOTE — Progress Notes (Signed)
[place] CONSULT NOTE  Patient Care Team: Bernerd Limbo, MD as PCP - General (Family Medicine)  CHIEF COMPLAINTS/PURPOSE OF CONSULTATION:   Peripheral T cell lymphoma, NOS specified.   ASSESSMENT & PLAN:  No problem-specific Assessment & Plan notes found for this encounter.  No orders of the defined types were placed in this encounter.  1. T cell lymphoma, NOS, CD 30 negative noticed from small bowel. Likely low or low-intermediate risk, staging pending. CT abdomen showed prominent mesenteric lymphadenopathy with anterior pericaval lymphadenopathy. She will need PET CT for complete staging. We have discussed about BMB but she wants to get a second opinion about treatment from [facility] or [place]. Labs ordered today, no TLS noted.  Since she is CD 30 negative, I have discussed about CHOP first line, but if tolerates it very well, we could consider CHOEP ( with IV etoposide on D1 and oral etoposide on D2 and D3) Risk stratification pending at this time. We will plan 6 cycles of CHOP followed by consideration for SCT. She will need baseline ECHO in preparation for chemo. We have discussed side effects of chemotherapy including but not limited to fatigue, nausea, vomiting, diarrhea, cardiotoxicity, neuropathy, increased risk of infections. She understands that some of these effects can be permanent and fatal.  Referral sent to Dr Thera Flake at [facility] She will touch base with me in 2 weeks.  She was encouraged to let us know about her plan if she was to pursue treatment in [place] as soon as possible. From my discussion with surgery team, she needs at least 2 weeks of healing before considering systemic chemotherapy. All her questions were answered to the best of my knowledge.  HISTORY OF PRESENTING ILLNESS:  Abigail Houston 70 y.o. female is here because of new diagnosis of peripheral T cell lymphoma, NOS.  This is a very pleasant 70 yr old female patient with PMH  significant for Type II NIDDM referred to hematology for diagnosis of Peripheral T cell lymphoma, NOS. [name] arrived to the appointment with her daughter. She was in excellent health prior to this event. She started noting some abdominal pain about 6 months ago which was on and off with some nausea and vomiting. Right before she came to the hospital, she started noticing worsening abdominal pain with repeated episodes of nausea and vomiting.  She had CT imaging which showed SBO with wall thickening and mesenteric edema, possibly also enteritis.  Prominent mesenteric and anterior pericaval lymphadenopathy, appearances suspicious for lymphoma, metastasis less likely. Endometrial fluid and thickening out of proportion to the patient's age. She had diagnostic laparoscopy and laparoscopic assisted small bowel resection. Pathology showed peripheral cell lymphoma, NOS She has been recovering well. She continues to have abdominal pain but has significantly improved. She has been having a good bowel movement. No hematochezia or melena. No other B symptoms.   REVIEW OF SYSTEMS:   Constitutional: Denies fevers, chills or abnormal night sweats Eyes: Denies blurriness of vision, double vision or watery eyes Ears, nose, mouth, throat, and face: Denies mucositis or sore throat Respiratory: Denies cough, dyspnea or wheezes Cardiovascular: Denies palpitation, chest discomfort or lower extremity swelling Gastrointestinal:  Denies nausea, heartburn or change in bowel habits Skin: Denies abnormal skin rashes Lymphatics: Denies new lymphadenopathy or easy bruising Neurological:Denies numbness, tingling or new weaknesses Behavioral/Psych: Mood is stable, no new changes  All other systems were reviewed with the patient and are negative.  MEDICAL HISTORY:  Past Medical History:  Diagnosis  Date  . Arthritis   . Cataract   . Diabetes mellitus without complication ([place])   . Sleep apnea    does not wear CPAP   . Snoring   . Thickened endometrium     SURGICAL HISTORY: Past Surgical History:  Procedure Laterality Date  . arm fracture  1984   left  . CESAREAN SECTION     one time  . EYE SURGERY  07/2017   right eye cataract surgery  . LAPAROSCOPY N/A 06/29/2020   Procedure: LAPAROSCOPY DIAGNOSTIC, SMALL  BOWEL RESECTION;  Surgeon: Clovis Riley, MD;  Location: [place];  Service: General;  Laterality: N/A;  . LAPAROTOMY N/A 06/29/2020   Procedure: EXPLORATORY LAPAROTOMY;  Surgeon: Clovis Riley, MD;  Location: [place];  Service: General;  Laterality: N/A;  . varicose veins  11/2017   left leg    SOCIAL HISTORY: Social History   Socioeconomic History  . Marital status: Single    Spouse name: Not on file  . Number of children: Not on file  . Years of education: Not on file  . Highest education level: Not on file  Occupational History  . Not on file  Tobacco Use  . Smoking status: Never Smoker  . Smokeless tobacco: Never Used  Vaping Use  . Vaping Use: Never used  Substance and Sexual Activity  . Alcohol use: Yes    Alcohol/week: 1.0 standard drink    Types: 1 Cans of beer per week    Comment: occasional  . Drug use: No  . Sexual activity: Not Currently  Other Topics Concern  . Not on file  Social History Narrative  . Not on file   Social Determinants of Health   Financial Resource Strain: Not on file  Food Insecurity: Not on file  Transportation Needs: Not on file  Physical Activity: Not on file  Stress: Not on file  Social Connections: Not on file  Intimate Partner Violence: Not on file    FAMILY HISTORY: Family History  Problem Relation Age of Onset  . Ovarian cancer Mother   . Diabetes Sister   . Heart disease Brother   . Colon cancer Neg Hx   . Esophageal cancer Neg Hx   . Stomach cancer Neg Hx   . Rectal cancer Neg Hx   . Colon polyps Neg Hx     ALLERGIES:  is allergic to motrin [ibuprofen], pravastatin, ampicillin, and  penicillins.  MEDICATIONS:  Current Outpatient Medications  Medication Sig Dispense Refill  . acetaminophen (TYLENOL) 325 MG tablet Take 2 tablets (650 mg total) by mouth every 6 (six) hours as needed for fever or mild pain.    Marland Kitchen aspirin EC 81 MG tablet Take 81 mg by mouth daily.    Marland Kitchen CALCIUM PO Take 1 tablet by mouth daily.    . cholecalciferol (VITAMIN D) 1000 units tablet Take 1,000 Units by mouth daily.    Marland Kitchen ezetimibe (ZETIA) 10 MG tablet Take 10 mg by mouth at bedtime.    . ferrous sulfate 325 (65 FE) MG tablet Take 325 mg by mouth daily with breakfast.    . furosemide (LASIX) 20 MG tablet Take 20 mg by mouth daily as needed for fluid or edema.    . metFORMIN (GLUCOPHAGE) 500 MG tablet Take 500 mg by mouth in the morning and at bedtime.    . Multiple Vitamins-Minerals (CENTRUM SILVER PO) Take 1 tablet by mouth daily.    . Omega-3 Fatty Acids (FISH OIL) 1000 MG  CAPS Take 1 capsule by mouth daily.    . ondansetron (ZOFRAN-ODT) 8 MG disintegrating tablet Take 8 mg by mouth every 8 (eight) hours as needed for nausea or vomiting.    Marland Kitchen oxyCODONE (OXY IR/ROXICODONE) 5 MG immediate release tablet Take 1 tablet (5 mg total) by mouth every 6 (six) hours as needed for severe pain. 15 tablet 0  . polyethylene glycol (MIRALAX) 17 g packet Take 17 g by mouth daily as needed for mild constipation. 14 each 0  . vitamin B-12 (CYANOCOBALAMIN) 1000 MCG tablet Take 1,000 mcg by mouth daily.    . vitamin C (ASCORBIC ACID) 500 MG tablet Take 500 mg by mouth daily.    Marland Kitchen VITAMIN E PO Take 1 capsule by mouth daily.    Marland Kitchen zinc gluconate 50 MG tablet Take 50 mg by mouth daily.     No current facility-administered medications for this visit.    PHYSICAL EXAMINATION: ECOG PERFORMANCE STATUS: 0 - Asymptomatic  There were no vitals filed for this visit. There were no vitals filed for this visit.  GENERAL:alert, no distress and comfortable SKIN: skin color, texture, turgor are normal, no rashes or significant  lesions EYES: normal, conjunctiva are pink and non-injected, sclera clear OROPHARYNX:no exudate, no erythema and lips, buccal mucosa, and tongue normal  NECK: supple, thyroid normal size, non-tender, without nodularity LYMPH:  no palpable lymphadenopathy in the cervical, axillary or inguinal LUNGS: clear to auscultation and percussion with normal breathing effort HEART: regular rate & rhythm and no murmurs and no lower extremity edema ABDOMEN:abdomen soft, non-tender and normal bowel sounds Musculoskeletal:no cyanosis of digits and no clubbing  PSYCH: alert & oriented x 3 with fluent speech NEURO: no focal motor/sensory deficits  LABORATORY DATA:  I have reviewed the data as listed Lab Results  Component Value Date   WBC 4.9 07/03/2020   HGB 10.5 (L) 07/03/2020   HCT 34.5 (L) 07/03/2020   MCV 91.5 07/03/2020   PLT 166 07/03/2020     Chemistry      Component Value Date/Time   NA 139 07/03/2020 0052   K 3.8 07/03/2020 0052   CL 107 07/03/2020 0052   CO2 23 07/03/2020 0052   BUN <5 (L) 07/03/2020 0052   CREATININE 0.60 07/03/2020 0052      Component Value Date/Time   CALCIUM 8.5 (L) 07/03/2020 0052   ALKPHOS 64 06/27/2020 1755   AST 18 06/27/2020 1755   ALT 14 06/27/2020 1755   BILITOT 0.7 06/27/2020 1755       RADIOGRAPHIC STUDIES: I have personally reviewed the radiological images as listed and agreed with the findings in the report. CT ABDOMEN PELVIS W CONTRAST  Result Date: 06/28/2020 CLINICAL DATA:  Epigastric pain and suprapubic pain. EXAM: CT ABDOMEN AND PELVIS WITH CONTRAST TECHNIQUE: Multidetector CT imaging of the abdomen and pelvis was performed using the standard protocol following bolus administration of intravenous contrast. CONTRAST:  146mL OMNIPAQUE IOHEXOL 300 MG/ML  SOLN COMPARISON:  None. FINDINGS: Lower chest: Lung bases are clear. Hepatobiliary: No focal liver abnormality is seen. No gallstones, gallbladder wall thickening, or biliary dilatation.  Pancreas: Unremarkable. No pancreatic ductal dilatation or surrounding inflammatory changes. Spleen: Normal in size without focal abnormality. Adrenals/Urinary Tract: Adrenal glands are unremarkable. Kidneys are normal, without renal calculi, focal lesion, or hydronephrosis. Bladder is unremarkable. Stomach/Bowel: Stomach is unremarkable. Dilated fluid-filled small bowel with mildly thickened wall. Decompressed terminal ileum with transition zone in the left abdomen. Mild mesenteric edema. Changes likely represent small bowel obstruction.  Enteritis may also be present. Colon is decompressed with scattered colonic diverticula. No evidence of diverticulitis. Appendix is normal. Vascular/Lymphatic: Prominent mesenteric lymphadenopathy with anterior pericaval lymphadenopathy. Pericaval node measures 2.8 cm short axis dimension. Mesenteric nodes measure up to 3.1 cm short axis dimension. Appearances are suspicious for lymphoma. Metastasis less likely. Reproductive: Endometrial fluid and thickening out of proportion to the patient's age. Suggestion of a right adnexal mass. Free fluid in the pelvis. Pelvic ultrasound is suggested for further characterization. Other: No free air.  Abdominal wall musculature appears intact. Musculoskeletal: No acute or significant osseous findings. IMPRESSION: 1. Small bowel obstruction with wall thickening and mesenteric edema, possibly also enteritis. 2. Prominent mesenteric and anterior pericaval lymphadenopathy. Appearances are suspicious for lymphoma. Metastasis less likely. 3. Endometrial fluid and thickening out of proportion to the patient's age. Right adnexal mass. Pelvic ultrasound is suggested for further characterization. 4. Free fluid in the pelvis. Electronically Signed   By: Lucienne Capers M.D.   On: 06/28/2020 01:13   US PELVIC COMPLETE WITH TRANSVAGINAL  Result Date: 06/28/2020 CLINICAL DATA:  Patient with recent CT and possible pelvic mass. EXAM: TRANSABDOMINAL AND  TRANSVAGINAL ULTRASOUND OF PELVIS TECHNIQUE: Both transabdominal and transvaginal ultrasound examinations of the pelvis were performed. Transabdominal technique was performed for global imaging of the pelvis including uterus, ovaries, adnexal regions, and pelvic cul-de-sac. It was necessary to proceed with endovaginal exam following the transabdominal exam to visualize the adnexal structures. COMPARISON:  CT abdomen pelvis 11/26/2020 FINDINGS: Uterus Measurements: 5.3 x 2.8 x 4.3 cm = volume: 33.5 mL. No fibroids or other mass visualized. Endometrium Thickness: 9 mm. The endometrium is thickened. There is fluid and heterogeneous soft tissue within the endometrial canal. Cannot exclude endometrial mass. Right ovary No definite right ovarian tissue is visualized. Left ovary Not visualized. Other findings Within the right adnexa there is a 6.9 x 2.9 x 5.7 cm heterogeneous soft tissue mass. This conglomerate mass appears to be a soft tissue mass involving a small bowel loop within the right hemipelvis. There is a small amount of surrounding fluid. IMPRESSION: 1. There is a 6.9 cm soft tissue mass within the right adnexa which appears to be a soft tissue mass associated with a loop of small bowel within the right lower abdomen. This appears to be located at the point of transition of the small bowel obstruction. Findings are concerning for possible small bowel lymphoma, carcinoid or small bowel adenocarcinoma. 2. The endometrium is thickened with a small amount of fluid and heterogeneous soft tissue. Cannot exclude underlying endometrial mass. Recommend non urgent gyn consultation and endometrial biopsy. 3. These results were called by telephone at the time of interpretation on 06/28/2020 at 10:23 am to provider Dr. Sharon Seller, who verbally acknowledged these results. Electronically Signed   By: Lovey Newcomer M.D.   On: 06/28/2020 10:52    SURGICAL PATHOLOGY  CASE: [id number]  PATIENT: Tawana Nunziato  Surgical Pathology  Report    Clinical History: Small bowel obstruction (lp)   FINAL MICROSCOPIC DIAGNOSIS:   A. SMALL BOWEL, LAPROSCOPIC ASSISTED RESECTION:  - T-cell lymphoma most consistent with peripheral T-cell lymphoma, NOS  - See comment   COMMENT:   The small bowel mass reveals sheets of intermediate-sized lymphoid cells  with irregular nuclear contours and scant cytoplasm. There is brisk  mitotic activity. The lymphoid proliferation markedly expands the  submucosa with extension into the lamina propria and ulceration of the  mucosa as well as extension deep into muscularis. Uninvolved small  bowel mucosa  shows no significant villous blunting, increase in  intraepithelial lymphocytes, or acute inflammation.   A panel of immunohistochemistry is performed for further  characterization. The neoplastic cells are positive for CD3, CD5, CD4,  and BCL2. They are negative for CD8, CD30 (<1%), BCL6, CD10, CD56, TdT,  CD34, CD20, cyclin D1, and CD23. The Ki67 proliferative rate is 80-90%.  EBV is negative by in situ hybridization.   Flow cytometric analysis demonstrates an atypical T-cell population (see  [id number]).   Together, the morphologic and immunophenotypic findings support the  diagnosis of a T-cell lymphoma, best classified as a peripheral T-cell  lymphoma, NOS.   All questions were answered. The patient knows to call the clinic with any problems, questions or concerns. I spent 60 minutes in the care of this patient including H and P, review of records, counseling and coordination of care.     Benay Pike, MD 07/08/2020 8:47 AM

## 2020-07-09 ENCOUNTER — Encounter: Payer: Self-pay | Admitting: Licensed Clinical Social Worker

## 2020-07-09 NOTE — Progress Notes (Signed)
[facility] Psychosocial Distress Screening Clinical Social Work  Clinical Social Work was referred by distress screening protocol.  The patient scored a 6 on the Psychosocial Distress Thermometer which indicates moderate distress. Clinical Social Worker contacted patient by phone to assess for distress and other psychosocial needs.   Patient reports feeling better after appointment yesterday but is still deciding "which way she is going to go". Reports strong support from her children both with practical and emotional needs.   CSW informed patient of the support team and support services at [facility].  CSW provided contact information and encouraged patient to call with any questions or concerns.  ONCBCN DISTRESS SCREENING 07/08/2020  Distress experienced in past week (1-10) 6  Emotional problem type Adjusting to illness    Clinical Social Worker follow up needed: No.  If yes, follow up plan:  Abigail Houston E Krishawn Vanderweele, LCSW

## 2020-07-14 ENCOUNTER — Telehealth: Payer: Self-pay

## 2020-07-14 NOTE — Telephone Encounter (Signed)
-----   Message from Aundria Rud sent at 07/13/2020 11:42 AM EST ----- Regarding: Janann August Morning,  I have spoken with Cathlyn Parsons W[facility] in reference to her PET. The member has a distinct restricted plan that limits where she may have imaging services performed.   Loma Sousa is stating that the member can have the test performed her at [facility]. I explained to her that [facility] stating that our facility is out of network with her plan.   Please have the member call her insurance company to make sure that she may have the test performed here or at a facility that is In Network to avoid unnecessary expenses. We are receiving conflicting information.   Referral is authorized for [facility] at this time.  Lexine Baton

## 2020-07-14 NOTE — Telephone Encounter (Signed)
I have tried calling the Patient to communicate the message sent to me by Marcie Bal. I left a detailed message on her voicemail per DPR and asked that she return my call so that we could go over this information together.

## 2020-07-16 ENCOUNTER — Telehealth: Payer: Self-pay

## 2020-07-16 NOTE — Telephone Encounter (Signed)
Pt called stating she contacted her insurance and was advised her PET was approved for [facility]. After much discussion with the pt regarding [facility] and [facility] both being apart of the [place], I advised the pt to schedule her PET at the [facility] location so as not to risk incurring an additional fees from her insurance company. Pt agreed.

## 2020-07-16 NOTE — Telephone Encounter (Signed)
I have faxed over a referral to Dr. Raylene Everts with [facility] for a second opinion consultation for this patient. (F) [phone]  I have made the Patient aware of this and advised her to call [facility] to set up an appointment with Dr. Thera Flake, the patient verbalized understanding.

## 2020-07-16 NOTE — Telephone Encounter (Signed)
I was finally able to contact the Patient in regards to her PET Scan set for 2/15. The Patient contacted her insurance and was able to verify that her PET scan had been approved and authorized to be completed at [facility].

## 2020-07-20 ENCOUNTER — Ambulatory Visit: Payer: Medicare Other | Admitting: Hematology and Oncology

## 2020-07-20 ENCOUNTER — Other Ambulatory Visit: Payer: Medicare Other

## 2020-07-21 ENCOUNTER — Other Ambulatory Visit: Payer: Self-pay

## 2020-07-21 ENCOUNTER — Ambulatory Visit (HOSPITAL_COMMUNITY)
Admission: RE | Admit: 2020-07-21 | Discharge: 2020-07-21 | Disposition: A | Discharge status: Home / Self Care | Payer: Medicare Other | Source: Ambulatory Visit | Attending: Hematology and Oncology | Admitting: Hematology and Oncology

## 2020-07-21 DIAGNOSIS — C8442 Peripheral T-cell lymphoma, not classified, intrathoracic lymph nodes: Secondary | ICD-10-CM | POA: Diagnosis present

## 2020-07-21 LAB — GLUCOSE, CAPILLARY: Glucose-Capillary: 130 mg/dL — ABNORMAL HIGH (ref 70–99)

## 2020-07-21 MED ORDER — FLUDEOXYGLUCOSE F - 18 (FDG) INJECTION
9.5200 | Freq: Once | INTRAVENOUS | Status: AC | PRN
  Administered 2020-07-21: 9.52 via INTRAVENOUS

## 2020-07-22 ENCOUNTER — Inpatient Hospital Stay (HOSPITAL_BASED_OUTPATIENT_CLINIC_OR_DEPARTMENT_OTHER): Payer: Medicare Other | Admitting: Hematology and Oncology

## 2020-07-22 ENCOUNTER — Encounter: Payer: Self-pay | Admitting: Hematology and Oncology

## 2020-07-22 ENCOUNTER — Telehealth: Payer: Self-pay | Admitting: Hematology and Oncology

## 2020-07-22 DIAGNOSIS — C8443 Peripheral T-cell lymphoma, not classified, intra-abdominal lymph nodes: Secondary | ICD-10-CM

## 2020-07-22 DIAGNOSIS — E669 Obesity, unspecified: Secondary | ICD-10-CM | POA: Diagnosis not present

## 2020-07-22 DIAGNOSIS — R948 Abnormal results of function studies of other organs and systems: Secondary | ICD-10-CM

## 2020-07-22 NOTE — Progress Notes (Signed)
[facility] CONSULT NOTE  Patient Care Team: Bernerd Limbo, MD as PCP - General (Family Medicine)  CHIEF COMPLAINTS/PURPOSE OF CONSULTATION:   Peripheral T cell lymphoma, NOS specified.   ASSESSMENT & PLAN:  No problem-specific Assessment & Plan notes found for this encounter.  No orders of the defined types were placed in this encounter.  1. T cell lymphoma, NOS, CD 30 negative noticed from small bowel. Low risk based on IPI scoring. CT abdomen showed prominent mesenteric lymphadenopathy with anterior pericaval lymphadenopathy. PET CT showed multiple large round lobular masses in the central mesentery and periaortic retroperitoneum with intense hypermetabolic activity consistent with high-grade lymphoma. We have discussed about BMB but she wants to get a second opinion about treatment from [facility] or [facility],hence dont want to do BMB here just in case she decided to do treatment there. Labs orderedno TLS noted.  Since she is CD 30 negative, I have discussed about CHOP first line, but if tolerates it very well, we could consider CHOEP ( with IV etoposide on D1 and oral etoposide on D2 and D3) We will plan 6 cycles of CHOP followed by consideration for SCT although SCT is not typically recommended in patient with low-low intermediate IPI , Five-year OS rates are 74 and 49 percent among patients with a low (ie, zero to 1) or low-intermediate (ie, 2) IPI score, respectively. She will need baseline ECHO in preparation for chemo. This has been ordered, scheduled for 2/22. We have discussed side effects of chemotherapy previously including but not limited to fatigue, nausea, vomiting, diarrhea, cardiotoxicity, neuropathy, increased risk of infections, will have scheduled chemoeducation. She understands that some of these effects can be permanent and fatal.   Referral sent to [facility] based on patient's request. I had to explain everything again because she had trouble  understanding. According to daughter she has trouble believing that she has lymphoma.  She again says " Do we know for sure I have lymphoma" despite discussion about surgical pathology and PET imaging results. I think unfortunately she has some barrier understanding the diagnosis and recommended treatment recommendations despite multiple repetition. She asked me to call her daughter at the number she gave me, called the daughter, explained everything about the above mentioned. She expressed understanding She will come back to see [place] for FU after her appointment at [facility] I explained that its important to treat in a timely manner and recommended avoiding delays as much as possible.  HISTORY OF PRESENTING ILLNESS:  Abigail Houston 70 y.o. female is here because of new diagnosis of peripheral T cell lymphoma, NOS.  This is a very pleasant 70 yr old female patient with PMH significant for Type II NIDDM referred to hematology for diagnosis of Peripheral T cell lymphoma, NOS. Abigail Houston arrived to the appointment with her daughter. She was in excellent health prior to this event. She started noting some abdominal pain about 6 months ago which was on and off with some nausea and vomiting. Right before she came to the hospital, she started noticing worsening abdominal pain with repeated episodes of nausea and vomiting.  She had CT imaging which showed SBO with wall thickening and mesenteric edema, possibly also enteritis.  Prominent mesenteric and anterior pericaval lymphadenopathy, appearances suspicious for lymphoma, metastasis less likely. Endometrial fluid and thickening out of proportion to the patient's age. She had diagnostic laparoscopy and laparoscopic assisted small bowel resection. Pathology showed peripheral cell lymphoma, NOS  PET CT showed multiple large round lobular  masses in the central mesentery and periaortic retroperitoneum with intense hypermetabolic activity consistent with high-grade  lymphoma. She has been recovering well. No abdominal pain, Bowel habits are normal No nausea or vomiting. She is eating well.   she is here on the telephone to review the results.  REVIEW OF SYSTEMS:    Constitutional: Denies fevers, chills or abnormal night sweats Eyes: Denies blurriness of vision, double vision or watery eyes Ears, nose, mouth, throat, and face: Denies mucositis or sore throat Respiratory: Denies cough, dyspnea or wheezes Cardiovascular: Denies palpitation, chest discomfort or lower extremity swelling Gastrointestinal:  Denies nausea, heartburn or change in bowel habits Skin: Denies abnormal skin rashes Lymphatics: Denies new lymphadenopathy or easy bruising Neurological:Denies numbness, tingling or new weaknesses Behavioral/Psych: Mood is stable, no new changes  All other systems were reviewed with the patient and are negative.  MEDICAL HISTORY:  Past Medical History:  Diagnosis Date  . Arthritis   . Cataract   . Diabetes mellitus without complication ([place])   . Sleep apnea    does not wear CPAP  . Snoring   . Thickened endometrium     SURGICAL HISTORY: Past Surgical History:  Procedure Laterality Date  . arm fracture  1984   left  . CESAREAN SECTION     one time  . EYE SURGERY  07/2017   right eye cataract surgery  . LAPAROSCOPY N/A 06/29/2020   Procedure: LAPAROSCOPY DIAGNOSTIC, SMALL  BOWEL RESECTION;  Surgeon: Clovis Riley, MD;  Location: [facility];  Service: General;  Laterality: N/A;  . LAPAROTOMY N/A 06/29/2020   Procedure: EXPLORATORY LAPAROTOMY;  Surgeon: Clovis Riley, MD;  Location: [facility];  Service: General;  Laterality: N/A;  . varicose veins  11/2017   left leg    SOCIAL HISTORY: Social History   Socioeconomic History  . Marital status: Single    Spouse name: Not on file  . Number of children: Not on file  . Years of education: Not on file  . Highest education level: Not on file  Occupational History  . Not on file   Tobacco Use  . Smoking status: Never Smoker  . Smokeless tobacco: Never Used  Vaping Use  . Vaping Use: Never used  Substance and Sexual Activity  . Alcohol use: Yes    Alcohol/week: 1.0 standard drink    Types: 1 Cans of beer per week    Comment: occasional  . Drug use: No  . Sexual activity: Not Currently  Other Topics Concern  . Not on file  Social History Narrative  . Not on file   Social Determinants of Health   Financial Resource Strain: Not on file  Food Insecurity: Not on file  Transportation Needs: Not on file  Physical Activity: Not on file  Stress: Not on file  Social Connections: Not on file  Intimate Partner Violence: Not on file    FAMILY HISTORY: Family History  Problem Relation Age of Onset  . Ovarian cancer Mother   . Diabetes Sister   . Heart disease Brother   . Colon cancer Neg Hx   . Esophageal cancer Neg Hx   . Stomach cancer Neg Hx   . Rectal cancer Neg Hx   . Colon polyps Neg Hx     ALLERGIES:  is allergic to motrin [ibuprofen], pravastatin, ampicillin, and penicillins.  MEDICATIONS:  Current Outpatient Medications  Medication Sig Dispense Refill  . acetaminophen (TYLENOL) 325 MG tablet Take 2 tablets (650 mg total) by mouth every  6 (six) hours as needed for fever or mild pain.    Marland Kitchen aspirin EC 81 MG tablet Take 81 mg by mouth daily.    Marland Kitchen CALCIUM PO Take 1 tablet by mouth daily.    . cholecalciferol (VITAMIN D) 1000 units tablet Take 1,000 Units by mouth daily.    Marland Kitchen ezetimibe (ZETIA) 10 MG tablet Take 10 mg by mouth at bedtime.    . ferrous sulfate 325 (65 FE) MG tablet Take 325 mg by mouth daily with breakfast.    . furosemide (LASIX) 20 MG tablet Take 20 mg by mouth daily as needed for fluid or edema. (Patient not taking: Reported on 07/08/2020)    . metFORMIN (GLUCOPHAGE) 500 MG tablet Take 500 mg by mouth in the morning and at bedtime.    . Multiple Vitamins-Minerals (CENTRUM SILVER PO) Take 1 tablet by mouth daily.    . Omega-3 Fatty  Acids (FISH OIL) 1000 MG CAPS Take 1 capsule by mouth daily.    . ondansetron (ZOFRAN-ODT) 8 MG disintegrating tablet Take 8 mg by mouth every 8 (eight) hours as needed for nausea or vomiting.    Marland Kitchen oxyCODONE (OXY IR/ROXICODONE) 5 MG immediate release tablet Take 1 tablet (5 mg total) by mouth every 6 (six) hours as needed for severe pain. 15 tablet 0  . polyethylene glycol (MIRALAX) 17 g packet Take 17 g by mouth daily as needed for mild constipation. 14 each 0  . vitamin B-12 (CYANOCOBALAMIN) 1000 MCG tablet Take 1,000 mcg by mouth daily.    . vitamin C (ASCORBIC ACID) 500 MG tablet Take 500 mg by mouth daily.    Marland Kitchen VITAMIN E PO Take 1 capsule by mouth daily.    Marland Kitchen zinc gluconate 50 MG tablet Take 50 mg by mouth daily.     No current facility-administered medications for this visit.    PHYSICAL EXAMINATION: ECOG PERFORMANCE STATUS: 0 - Asymptomatic  There were no vitals filed for this visit. There were no vitals filed for this visit.  PE not done. Telephone visit.  LABORATORY DATA:  I have reviewed the data as listed Lab Results  Component Value Date   WBC 6.9 07/08/2020   HGB 11.7 (L) 07/08/2020   HCT 37.4 07/08/2020   MCV 89.3 07/08/2020   PLT 217 07/08/2020     Chemistry      Component Value Date/Time   NA 142 07/08/2020 1413   K 3.5 07/08/2020 1413   CL 108 07/08/2020 1413   CO2 25 07/08/2020 1413   BUN 8 07/08/2020 1413   CREATININE 0.72 07/08/2020 1413      Component Value Date/Time   CALCIUM 9.5 07/08/2020 1413   ALKPHOS 97 07/08/2020 1413   AST 16 07/08/2020 1413   ALT 33 07/08/2020 1413   BILITOT 0.4 07/08/2020 1413       RADIOGRAPHIC STUDIES: I have personally reviewed the radiological images as listed and agreed with the findings in the report. CT ABDOMEN PELVIS W CONTRAST  Result Date: 06/28/2020 CLINICAL DATA:  Epigastric pain and suprapubic pain. EXAM: CT ABDOMEN AND PELVIS WITH CONTRAST TECHNIQUE: Multidetector CT imaging of the abdomen and pelvis  was performed using the standard protocol following bolus administration of intravenous contrast. CONTRAST:  OMNIPAQUE IOHEXOL 300 MG/ML  SOLN COMPARISON:  None. FINDINGS: Lower chest: Lung bases are clear. Hepatobiliary: No focal liver abnormality is seen. No gallstones, gallbladder wall thickening, or biliary dilatation. Pancreas: Unremarkable. No pancreatic ductal dilatation or surrounding inflammatory changes. Spleen: Normal in  size without focal abnormality. Adrenals/Urinary Tract: Adrenal glands are unremarkable. Kidneys are normal, without renal calculi, focal lesion, or hydronephrosis. Bladder is unremarkable. Stomach/Bowel: Stomach is unremarkable. Dilated fluid-filled small bowel with mildly thickened wall. Decompressed terminal ileum with transition zone in the left abdomen. Mild mesenteric edema. Changes likely represent small bowel obstruction. Enteritis may also be present. Colon is decompressed with scattered colonic diverticula. No evidence of diverticulitis. Appendix is normal. Vascular/Lymphatic: Prominent mesenteric lymphadenopathy with anterior pericaval lymphadenopathy. Pericaval node measures 2.8 cm short axis dimension. Mesenteric nodes measure up to 3.1 cm short axis dimension. Appearances are suspicious for lymphoma. Metastasis less likely. Reproductive: Endometrial fluid and thickening out of proportion to the patient's age. Suggestion of a right adnexal mass. Free fluid in the pelvis. Pelvic ultrasound is suggested for further characterization. Other: No free air.  Abdominal wall musculature appears intact. Musculoskeletal: No acute or significant osseous findings. IMPRESSION: 1. Small bowel obstruction with wall thickening and mesenteric edema, possibly also enteritis. 2. Prominent mesenteric and anterior pericaval lymphadenopathy. Appearances are suspicious for lymphoma. Metastasis less likely. 3. Endometrial fluid and thickening out of proportion to the patient's age. Right  adnexal mass. Pelvic ultrasound is suggested for further characterization. 4. Free fluid in the pelvis. Electronically Signed   By: Lucienne Capers M.D.   On: 06/28/2020 01:13   NM PET Image Initial (PI) Skull Base To Thigh  Result Date: 07/21/2020 CLINICAL DATA:  Initial treatment strategy for T-cell lymphoma. EXAM: NUCLEAR MEDICINE PET SKULL BASE TO THIGH TECHNIQUE: 9.5 mCi F-18 FDG was injected intravenously. Full-ring PET imaging was performed from the skull base to thigh after the radiotracer. CT data was obtained and used for attenuation correction and anatomic localization. Fasting blood glucose: 130 mg/dl COMPARISON:  CT 11/26/2020 FINDINGS: Mediastinal blood pool activity: SUV max 2.5 Liver activity: SUV max 4.1 NECK: No hypermetabolic lymph nodes in the neck. Incidental CT findings: None CHEST: No hypermetabolic mediastinal or hilar nodes. No suspicious pulmonary nodules on the CT scan. Incidental CT findings: none ABDOMEN/PELVIS: Within the central periaortic peritoneal space, multiple adjacent lobular masses with intense metabolic activity. For example lesion the central RIGHT abdomen measuring 4.7 cm (image 123) with SUV max equal 28.5. Lesion between the IVC and aorta at the level of the lower pole the kidneys measuring 2.3 cm with SUV max equal 23.3. Most inferior mesenteric nodular mass measures 1.8 cm on image 129 with SUV max equal 19.0. No hypermetabolic lymph nodes in the pelvis. No hypermetabolic inguinal nodes. No abnormal metabolic activity associated with the spleen. Liver normal. No abnormal activity associated with the uterus or adnexa. Incidental CT findings: Postsurgical change consistent partial small bowel resection anastomosis in the central lower abdomen (image 141). SKELETON: No focal hypermetabolic activity to suggest skeletal metastasis. Incidental CT findings: none IMPRESSION: 1. Multiple large round lobular masses in the central mesentery and periaortic retroperitoneum with  intense hypermetabolic activity consistent with high-grade lymphoma. 2. No evidence of pelvic lymphadenopathy or thoracic lymphadenopathy. 3. Normal spleen and bone marrow. 4. Postsurgical change consistent partial small bowel resection. Electronically Signed   By: Suzy Bouchard M.D.   On: 07/21/2020 21:00   US PELVIC COMPLETE WITH TRANSVAGINAL  Result Date: 06/28/2020 CLINICAL DATA:  Patient with recent CT and possible pelvic mass. EXAM: TRANSABDOMINAL AND TRANSVAGINAL ULTRASOUND OF PELVIS TECHNIQUE: Both transabdominal and transvaginal ultrasound examinations of the pelvis were performed. Transabdominal technique was performed for global imaging of the pelvis including uterus, ovaries, adnexal regions, and pelvic cul-de-sac. It was necessary to  proceed with endovaginal exam following the transabdominal exam to visualize the adnexal structures. COMPARISON:  CT abdomen pelvis 11/26/2020 FINDINGS: Uterus Measurements: 5.3 x 2.8 x 4.3 cm = volume: 33.5 mL. No fibroids or other mass visualized. Endometrium Thickness: 9 mm. The endometrium is thickened. There is fluid and heterogeneous soft tissue within the endometrial canal. Cannot exclude endometrial mass. Right ovary No definite right ovarian tissue is visualized. Left ovary Not visualized. Other findings Within the right adnexa there is a 6.9 x 2.9 x 5.7 cm heterogeneous soft tissue mass. This conglomerate mass appears to be a soft tissue mass involving a small bowel loop within the right hemipelvis. There is a small amount of surrounding fluid. IMPRESSION: 1. There is a 6.9 cm soft tissue mass within the right adnexa which appears to be a soft tissue mass associated with a loop of small bowel within the right lower abdomen. This appears to be located at the point of transition of the small bowel obstruction. Findings are concerning for possible small bowel lymphoma, carcinoid or small bowel adenocarcinoma. 2. The endometrium is thickened with a small amount  of fluid and heterogeneous soft tissue. Cannot exclude underlying endometrial mass. Recommend non urgent gyn consultation and endometrial biopsy. 3. These results were called by telephone at the time of interpretation on 06/28/2020 at 10:23 am to provider Dr. Sharon Seller, who verbally acknowledged these results. Electronically Signed   By: Lovey Newcomer M.D.   On: 06/28/2020 10:52    SURGICAL PATHOLOGY  CASE: [id number]  PATIENT: Abigail Houston  Surgical Pathology Report    Clinical History: Small bowel obstruction (lp)   FINAL MICROSCOPIC DIAGNOSIS:   A. SMALL BOWEL, LAPROSCOPIC ASSISTED RESECTION:  - T-cell lymphoma most consistent with peripheral T-cell lymphoma, NOS  - See comment   COMMENT:   The small bowel mass reveals sheets of intermediate-sized lymphoid cells  with irregular nuclear contours and scant cytoplasm. There is brisk  mitotic activity. The lymphoid proliferation markedly expands the  submucosa with extension into the lamina propria and ulceration of the  mucosa as well as extension deep into muscularis. Uninvolved small  bowel mucosa shows no significant villous blunting, increase in  intraepithelial lymphocytes, or acute inflammation.   A panel of immunohistochemistry is performed for further  characterization. The neoplastic cells are positive for CD3, CD5, CD4,  and BCL2. They are negative for CD8, CD30 (<1%), BCL6, CD10, CD56, TdT,  CD34, CD20, cyclin D1, and CD23. The Ki67 proliferative rate is 80-90%.  EBV is negative by in situ hybridization.   Flow cytometric analysis demonstrates an atypical T-cell population (see  [id number]).   Together, the morphologic and immunophenotypic findings support the  diagnosis of a T-cell lymphoma, best classified as a peripheral T-cell  lymphoma, NOS.   Reviewed PET imaging independently and reviewed results.  07/22/2020 IMPRESSION: 1. Multiple large round lobular masses in the central mesentery and periaortic  retroperitoneum with intense hypermetabolic activity consistent with high-grade lymphoma. 2. No evidence of pelvic lymphadenopathy or thoracic lymphadenopathy. 3. Normal spleen and bone marrow. 4. Postsurgical change consistent partial small bowel resection.  All questions were answered. The patient knows to call the clinic with any problems, questions or concerns.  I connected with  Abigail Houston on 07/22/20 by a telephone application and verified that I am speaking with the correct person using two identifiers.   I discussed the limitations of evaluation and management by telemedicine. The patient expressed understanding and agreed to proceed.   I spent 45 minutes  in the care of this patient including History, review of records, counseling and coordination of care.     Benay Pike, MD 07/22/2020 12:18 PM

## 2020-07-22 NOTE — Telephone Encounter (Signed)
Scheduled appt 2/16 sch msg - pt is aware of appt date and time

## 2020-07-28 ENCOUNTER — Other Ambulatory Visit (HOSPITAL_COMMUNITY): Payer: Medicare Other

## 2020-08-06 ENCOUNTER — Other Ambulatory Visit: Payer: Self-pay

## 2020-08-06 ENCOUNTER — Ambulatory Visit (HOSPITAL_COMMUNITY)
Admission: RE | Admit: 2020-08-06 | Discharge: 2020-08-06 | Disposition: A | Discharge status: Home / Self Care | Payer: Medicare Other | Source: Ambulatory Visit | Attending: Hematology and Oncology | Admitting: Hematology and Oncology

## 2020-08-06 ENCOUNTER — Encounter: Payer: Self-pay | Admitting: Hematology and Oncology

## 2020-08-06 ENCOUNTER — Telehealth: Payer: Self-pay

## 2020-08-06 ENCOUNTER — Inpatient Hospital Stay: Payer: Medicare Other | Attending: Hematology and Oncology | Admitting: Hematology and Oncology

## 2020-08-06 VITALS — BP 122/88 | HR 91 | Temp 97.9°F | Resp 13 | Ht 66.0 in | Wt 186.6 lb

## 2020-08-06 DIAGNOSIS — C8443 Peripheral T-cell lymphoma, not classified, intra-abdominal lymph nodes: Secondary | ICD-10-CM | POA: Diagnosis not present

## 2020-08-06 DIAGNOSIS — Z01818 Encounter for other preprocedural examination: Secondary | ICD-10-CM | POA: Insufficient documentation

## 2020-08-06 DIAGNOSIS — E119 Type 2 diabetes mellitus without complications: Secondary | ICD-10-CM | POA: Insufficient documentation

## 2020-08-06 DIAGNOSIS — C8442 Peripheral T-cell lymphoma, not classified, intrathoracic lymph nodes: Secondary | ICD-10-CM | POA: Diagnosis not present

## 2020-08-06 DIAGNOSIS — G4730 Sleep apnea, unspecified: Secondary | ICD-10-CM | POA: Insufficient documentation

## 2020-08-06 DIAGNOSIS — C8449 Peripheral T-cell lymphoma, not classified, extranodal and solid organ sites: Secondary | ICD-10-CM | POA: Diagnosis not present

## 2020-08-06 LAB — ECHOCARDIOGRAM COMPLETE
Area-P 1/2: 3.03 cm2
S' Lateral: 3.3 cm

## 2020-08-06 NOTE — Progress Notes (Signed)
  Echocardiogram 2D Echocardiogram has been performed.  Randa Lynn Dance 08/06/2020, 11:36 AM

## 2020-08-06 NOTE — Telephone Encounter (Signed)
Per Dr. Chryl Heck, I have sent an urgent referral to [facility] with receipt of confirmation to (f) [phone]. The fax included Ms. Mattix's most recent office notes, demo/inusrance information, and referral.

## 2020-08-06 NOTE — Progress Notes (Signed)
[facility] CONSULT NOTE  Patient Care Team: Tracey Harries, MD as PCP - General (Family Medicine)  CHIEF COMPLAINTS/PURPOSE OF CONSULTATION:   Peripheral T cell lymphoma, NOS specified.   ASSESSMENT & PLAN:  No problem-specific Assessment & Plan notes found for this encounter.  Orders Placed This Encounter  Procedures  . IR IMAGING GUIDED PORT INSERTION    Standing Status:   Future    Standing Expiration Date:   08/06/2021    Order Specific Question:   Reason for Exam (SYMPTOM  OR DIAGNOSIS REQUIRED)    Answer:   Chemotherapy administration.    Order Specific Question:   Preferred Imaging Location?    Answer:   [facility]  . CT BIOPSY    Standing Status:   Future    Standing Expiration Date:   08/06/2021    Order Specific Question:   Lab orders requested (DO NOT place separate lab orders, these will be automatically ordered during procedure specimen collection):    Answer:   CSF cell count w differential    Order Specific Question:   Reason for Exam (SYMPTOM  OR DIAGNOSIS REQUIRED)    Answer:   bone marrow    Order Specific Question:   Preferred imaging location?    Answer:   [facility]    Order Specific Question:   Radiology Contrast Protocol - do NOT remove file path    Answer:   \\[email or web address].pdf  . CT BONE MARROW BIOPSY & ASPIRATION    Standing Status:   Future    Standing Expiration Date:   08/06/2021    Order Specific Question:   Reason for Exam (SYMPTOM  OR DIAGNOSIS REQUIRED)    Answer:   Peripheral T cell lymphoma staging    Order Specific Question:   Preferred imaging location?    Answer:   [facility]    Order Specific Question:   Call Results- Best Contact Number?    Answer:   [phone]    Order Specific Question:   Radiology Contrast Protocol - do NOT remove file path    Answer:   \\[email or web address].[email or web address]  . Ambulatory referral to Hematology / Oncology    Referral Priority:    Urgent    Referral Type:   Consultation    Referral Reason:   Specialty Services Required    Requested Specialty:   Oncology    Number of Visits Requested:   1   1. T cell lymphoma, NOS, CD 30 negative noticed from small bowel.  Likely low or low-intermediate risk. CT abdomen showed prominent mesenteric lymphadenopathy with anterior pericaval lymphadenopathy. She will need PET CT for complete staging. We have discussed about BMB but she wants to get a second opinion about treatment from [facility] or [place]. Labs ordered today, no TLS noted.  Since she is CD 30 negative, I have discussed about CHOP first line, but if tolerates it very well, we could consider CHOEP ( with IV etoposide on D1 and oral etoposide on D2 and D3) We will plan 6 cycles of CHOP followed by consideration for SCT. She saw Dr Elmon Kirschner at [facility] who agreed with our recommendations. Baseline ECHO satisfactory. We have discussed side effects of chemotherapy including but not limited to fatigue, nausea, vomiting, diarrhea, cardiotoxicity, neuropathy, increased risk of infections. She understands that some of these effects can be permanent and fatal.  She is here for further discussion after her appointment with Dr Elmon Kirschner. She is doing well, no concerning ROS except  for some back pain, PE today is benign Recent labs from [facility] reviewed, no concern for TLS She would like to move to [place] for treatment since her 3 daughters live there. We will arrange for port placement and BMB in anticipation of treatment soon. We have placed an external referral to [facility],  All her questions were answered to the best of my knowledge.  HISTORY OF PRESENTING ILLNESS:   Abigail Houston 70 y.o. female is here because of new diagnosis of peripheral T cell lymphoma, NOS.  This is a very pleasant 71 yr old female patient with PMH significant for Type II NIDDM referred to hematology for diagnosis of Peripheral T cell lymphoma,  NOS.  Chronology  She started noting some abdominal pain about 6 months ago which was on and off with some nausea and vomiting. Right before she came to the hospital, she started noticing worsening abdominal pain with repeated episodes of nausea and vomiting.  She had CT imaging which showed SBO with wall thickening and mesenteric edema, possibly also enteritis.  Prominent mesenteric and anterior pericaval lymphadenopathy, appearances suspicious for lymphoma, metastasis less likely. Endometrial fluid and thickening out of proportion to the patient's age. She had diagnostic laparoscopy and laparoscopic assisted small bowel resection. Pathology showed peripheral cell lymphoma, NOS We recommended CHOP first line followed by consideration of SCT She also saw Dr Reita Chard who agreed with our recommendations. She is here for FU after her discussion with Dr Reita Chard She is doing well, no concerns.  No abdominal pain, has noted some back pain recently especially on some movement. No nausea, vomiting,  hematochezia or melena. She has regular bowel movements. Appetite is decent. She lost about 2 lbs she says. No other B symptoms. No change in breathing, urinary habits Rest of the pertinent 10 point ROS reviewed and negative.  REVIEW OF SYSTEMS:   Constitutional: Denies fevers, chills or abnormal night sweats Eyes: Denies blurriness of vision, double vision or watery eyes Ears, nose, mouth, throat, and face: Denies mucositis or sore throat Respiratory: Denies cough, dyspnea or wheezes Cardiovascular: Denies palpitation, chest discomfort or lower extremity swelling Gastrointestinal:  Denies nausea, heartburn or change in bowel habits Skin: Denies abnormal skin rashes Lymphatics: Denies new lymphadenopathy or easy bruising Neurological:Denies numbness, tingling or new weaknesses Behavioral/Psych: Mood is stable, no new changes  All other systems were reviewed with the patient and are  negative.  MEDICAL HISTORY:  Past Medical History:  Diagnosis Date  . Arthritis   . Cataract   . Diabetes mellitus without complication ([place])   . Sleep apnea    does not wear CPAP  . Snoring   . Thickened endometrium     SURGICAL HISTORY: Past Surgical History:  Procedure Laterality Date  . arm fracture  1984   left  . CESAREAN SECTION     one time  . EYE SURGERY  07/2017   right eye cataract surgery  . LAPAROSCOPY N/A 06/29/2020   Procedure: LAPAROSCOPY DIAGNOSTIC, SMALL  BOWEL RESECTION;  Surgeon: Clovis Riley, MD;  Location: [place];  Service: General;  Laterality: N/A;  . LAPAROTOMY N/A 06/29/2020   Procedure: EXPLORATORY LAPAROTOMY;  Surgeon: Clovis Riley, MD;  Location: [place];  Service: General;  Laterality: N/A;  . varicose veins  11/2017   left leg    SOCIAL HISTORY: Social History   Socioeconomic History  . Marital status: Single    Spouse name: Not on file  . Number of children: Not on  file  . Years of education: Not on file  . Highest education level: Not on file  Occupational History  . Not on file  Tobacco Use  . Smoking status: Never Smoker  . Smokeless tobacco: Never Used  Vaping Use  . Vaping Use: Never used  Substance and Sexual Activity  . Alcohol use: Yes    Alcohol/week: 1.0 standard drink    Types: 1 Cans of beer per week    Comment: occasional  . Drug use: No  . Sexual activity: Not Currently  Other Topics Concern  . Not on file  Social History Narrative  . Not on file   Social Determinants of Health   Financial Resource Strain: Not on file  Food Insecurity: Not on file  Transportation Needs: Not on file  Physical Activity: Not on file  Stress: Not on file  Social Connections: Not on file  Intimate Partner Violence: Not on file    FAMILY HISTORY: Family History  Problem Relation Age of Onset  . Ovarian cancer Mother   . Diabetes Sister   . Heart disease Brother   . Colon cancer Neg Hx   . Esophageal cancer Neg Hx    . Stomach cancer Neg Hx   . Rectal cancer Neg Hx   . Colon polyps Neg Hx     ALLERGIES:  is allergic to motrin [ibuprofen], pravastatin, ampicillin, and penicillins.  MEDICATIONS:  Current Outpatient Medications  Medication Sig Dispense Refill  . acetaminophen (TYLENOL) 325 MG tablet Take 2 tablets (650 mg total) by mouth every 6 (six) hours as needed for fever or mild pain.    Marland Kitchen aspirin EC 81 MG tablet Take 81 mg by mouth daily.    Marland Kitchen CALCIUM PO Take 1 tablet by mouth daily.    . cholecalciferol (VITAMIN D) 1000 units tablet Take 1,000 Units by mouth daily.    Marland Kitchen ezetimibe (ZETIA) 10 MG tablet Take 10 mg by mouth at bedtime.    . ferrous sulfate 325 (65 FE) MG tablet Take 325 mg by mouth daily with breakfast.    . furosemide (LASIX) 20 MG tablet Take 20 mg by mouth daily as needed for fluid or edema.    . metFORMIN (GLUCOPHAGE) 500 MG tablet Take 500 mg by mouth in the morning and at bedtime.    . Multiple Vitamins-Minerals (CENTRUM SILVER PO) Take 1 tablet by mouth daily.    . Omega-3 Fatty Acids (FISH OIL) 1000 MG CAPS Take 1 capsule by mouth daily.    . ondansetron (ZOFRAN-ODT) 8 MG disintegrating tablet Take 8 mg by mouth every 8 (eight) hours as needed for nausea or vomiting.    Marland Kitchen oxyCODONE (OXY IR/ROXICODONE) 5 MG immediate release tablet Take 1 tablet (5 mg total) by mouth every 6 (six) hours as needed for severe pain. 15 tablet 0  . polyethylene glycol (MIRALAX) 17 g packet Take 17 g by mouth daily as needed for mild constipation. 14 each 0  . vitamin B-12 (CYANOCOBALAMIN) 1000 MCG tablet Take 1,000 mcg by mouth daily.    . vitamin C (ASCORBIC ACID) 500 MG tablet Take 500 mg by mouth daily.    Marland Kitchen VITAMIN E PO Take 1 capsule by mouth daily.    Marland Kitchen zinc gluconate 50 MG tablet Take 50 mg by mouth daily.     No current facility-administered medications for this visit.    PHYSICAL EXAMINATION: ECOG PERFORMANCE STATUS: 0 - Asymptomatic  Vitals:   08/06/20 1507  BP: 122/88  Pulse: 91  Resp: 13  Temp: 97.9 F (36.6 C)  SpO2: 100%   Filed Weights   08/06/20 1507  Weight: 186 lb 9.6 oz (84.6 kg)    GENERAL:alert, no distress and comfortable SKIN: skin color, texture, turgor are normal, no rashes or significant lesions EYES: normal, conjunctiva are pink and non-injected, sclera clear OROPHARYNX:no exudate, no erythema and lips, buccal mucosa, and tongue normal  NECK: supple, thyroid normal size, non-tender, without nodularity LYMPH:  no palpable lymphadenopathy in the cervical, axillary or inguinal LUNGS: clear to auscultation and percussion with normal breathing effort HEART: regular rate & rhythm and no murmurs and no lower extremity edema ABDOMEN:abdomen soft, non-tender and normal bowel sounds Musculoskeletal:no cyanosis of digits and no clubbing  PSYCH: alert & oriented x 3 with fluent speech NEURO: no focal motor/sensory deficits  LABORATORY DATA:  I have reviewed the data as listed Lab Results  Component Value Date   WBC 6.9 07/08/2020   HGB 11.7 (L) 07/08/2020   HCT 37.4 07/08/2020   MCV 89.3 07/08/2020   PLT 217 07/08/2020     Chemistry      Component Value Date/Time   NA 142 07/08/2020 1413   K 3.5 07/08/2020 1413   CL 108 07/08/2020 1413   CO2 25 07/08/2020 1413   BUN 8 07/08/2020 1413   CREATININE 0.72 07/08/2020 1413      Component Value Date/Time   CALCIUM 9.5 07/08/2020 1413   ALKPHOS 97 07/08/2020 1413   AST 16 07/08/2020 1413   ALT 33 07/08/2020 1413   BILITOT 0.4 07/08/2020 1413       RADIOGRAPHIC STUDIES: I have personally reviewed the radiological images as listed and agreed with the findings in the report. NM PET Image Initial (PI) Skull Base To Thigh  Result Date: 07/21/2020 CLINICAL DATA:  Initial treatment strategy for T-cell lymphoma. EXAM: NUCLEAR MEDICINE PET SKULL BASE TO THIGH TECHNIQUE: 9.5 mCi F-18 FDG was injected intravenously. Full-ring PET imaging was performed from the skull base to thigh after  the radiotracer. CT data was obtained and used for attenuation correction and anatomic localization. Fasting blood glucose: 130 mg/dl COMPARISON:  CT 11/26/2020 FINDINGS: Mediastinal blood pool activity: SUV max 2.5 Liver activity: SUV max 4.1 NECK: No hypermetabolic lymph nodes in the neck. Incidental CT findings: None CHEST: No hypermetabolic mediastinal or hilar nodes. No suspicious pulmonary nodules on the CT scan. Incidental CT findings: none ABDOMEN/PELVIS: Within the central periaortic peritoneal space, multiple adjacent lobular masses with intense metabolic activity. For example lesion the central RIGHT abdomen measuring 4.7 cm (image 123) with SUV max equal 28.5. Lesion between the IVC and aorta at the level of the lower pole the kidneys measuring 2.3 cm with SUV max equal 23.3. Most inferior mesenteric nodular mass measures 1.8 cm on image 129 with SUV max equal 19.0. No hypermetabolic lymph nodes in the pelvis. No hypermetabolic inguinal nodes. No abnormal metabolic activity associated with the spleen. Liver normal. No abnormal activity associated with the uterus or adnexa. Incidental CT findings: Postsurgical change consistent partial small bowel resection anastomosis in the central lower abdomen (image 141). SKELETON: No focal hypermetabolic activity to suggest skeletal metastasis. Incidental CT findings: none IMPRESSION: 1. Multiple large round lobular masses in the central mesentery and periaortic retroperitoneum with intense hypermetabolic activity consistent with high-grade lymphoma. 2. No evidence of pelvic lymphadenopathy or thoracic lymphadenopathy. 3. Normal spleen and bone marrow. 4. Postsurgical change consistent partial small bowel resection. Electronically Signed   By: Suzy Bouchard  M.D.   On: 07/21/2020 21:00   ECHOCARDIOGRAM COMPLETE  Result Date: 08/06/2020    ECHOCARDIOGRAM REPORT   Patient Name:   Abigail Houston Date of Exam: 08/06/2020 Medical Rec #:  [id number]    Height:       66.0  in Accession #:    [id number]   Weight:       190.4 lb Date of Birth:  24-Feb-1951   BSA:          1.958 m Patient Age:    19 years     BP:           136/84 mmHg Patient Gender: F            HR:           71 bpm. Exam Location:  Outpatient Procedure: 2D Echo, 3D Echo, Cardiac Doppler, Color Doppler and Strain Analysis Indications:    Z51.11 Encounter for antineoplastic chemotheraphy  History:        Patient has no prior history of Echocardiogram examinations.                 Risk Factors:Diabetes and Sleep Apnea.  Sonographer:    Tiffany Dance Referring Phys: [id number] [place]  1. Left ventricular ejection fraction, by estimation, is 55 to 60%. The left ventricle has normal function. The left ventricle has no regional wall motion abnormalities. Left ventricular diastolic parameters were normal.  2. Right ventricular systolic function is normal. The right ventricular size is normal. There is normal pulmonary artery systolic pressure.  3. The mitral valve is normal in structure. Trivial mitral valve regurgitation. No evidence of mitral stenosis.  4. The aortic valve is normal in structure. Aortic valve regurgitation is not visualized. FINDINGS  Left Ventricle: Left ventricular ejection fraction, by estimation, is 55 to 60%. The left ventricle has normal function. The left ventricle has no regional wall motion abnormalities. The left ventricular internal cavity size was normal in size. There is  no left ventricular hypertrophy. Left ventricular diastolic parameters were normal. Right Ventricle: The right ventricular size is normal. No increase in right ventricular wall thickness. Right ventricular systolic function is normal. There is normal pulmonary artery systolic pressure. The tricuspid regurgitant velocity is 2.23 m/s, and  with an assumed right atrial pressure of 3 mmHg, the estimated right ventricular systolic pressure is 97.3 mmHg. Left Atrium: Left atrial size was normal in size. Right Atrium:  Right atrial size was normal in size. Pericardium: There is no evidence of pericardial effusion. Mitral Valve: The mitral valve is normal in structure. Trivial mitral valve regurgitation. No evidence of mitral valve stenosis. Tricuspid Valve: The tricuspid valve is normal in structure. Tricuspid valve regurgitation is not demonstrated. Aortic Valve: The aortic valve is normal in structure. Aortic valve regurgitation is not visualized. Pulmonic Valve: The pulmonic valve was normal in structure. Pulmonic valve regurgitation is not visualized. Aorta: The aortic root and ascending aorta are structurally normal, with no evidence of dilitation. IAS/Shunts: The atrial septum is grossly normal.  LEFT VENTRICLE PLAX 2D LVIDd:         4.30 cm  Diastology LVIDs:         3.30 cm  LV e' medial:    7.62 cm/s LV PW:         1.10 cm  LV E/e' medial:  9.7 LV IVS:        1.10 cm  LV e' lateral:   12.30 cm/s LVOT diam:  1.80 cm  LV E/e' lateral: 6.0 LV SV:         50 LV SV Index:   26 LVOT Area:     2.54 cm                          3D Volume EF:                         3D EF:        57 %                         LV EDV:       156 ml                         LV ESV:       67 ml                         LV SV:        88 ml RIGHT VENTRICLE            IVC RV Basal diam:  2.30 cm    IVC diam: 1.10 cm RV S prime:     9.36 cm/s TAPSE (M-mode): 2.0 cm LEFT ATRIUM             Index       RIGHT ATRIUM           Index LA diam:        4.00 cm 2.04 cm/m  RA Area:     14.00 cm LA Vol (A2C):   82.3 ml 42.02 ml/m RA Volume:   31.10 ml  15.88 ml/m LA Vol (A4C):   48.1 ml 24.56 ml/m LA Biplane Vol: 63.9 ml 32.63 ml/m  AORTIC VALVE LVOT Vmax:   95.80 cm/s LVOT Vmean:  68.100 cm/s LVOT VTI:    0.197 m  AORTA Ao Root diam: 3.20 cm Ao Asc diam:  3.60 cm MITRAL VALVE               TRICUSPID VALVE MV Area (PHT): 3.03 cm    TR Peak grad:   19.9 mmHg MV Decel Time: 250 msec    TR Vmax:        223.00 cm/s MV E velocity: 73.70 cm/s MV A velocity: 73.10  cm/s  SHUNTS MV E/A ratio:  1.01        Systemic VTI:  0.20 m                            Systemic Diam: 1.80 cm Mertie Moores MD Electronically signed by Mertie Moores MD Signature Date/Time: 08/06/2020/1:25:22 PM    Final     SURGICAL PATHOLOGY  CASE: [id number]  PATIENT: Kallista Leventhal  Surgical Pathology Report    Clinical History: Small bowel obstruction (lp)   FINAL MICROSCOPIC DIAGNOSIS:   A. SMALL BOWEL, LAPROSCOPIC ASSISTED RESECTION:  - T-cell lymphoma most consistent with peripheral T-cell lymphoma, NOS  - See comment   COMMENT:   The small bowel mass reveals sheets of intermediate-sized lymphoid cells  with irregular nuclear contours and scant cytoplasm. There is brisk  mitotic activity. The lymphoid proliferation markedly expands the  submucosa with extension into the lamina propria and ulceration of the  mucosa as well as extension deep  into muscularis. Uninvolved small  bowel mucosa shows no significant villous blunting, increase in  intraepithelial lymphocytes, or acute inflammation.   A panel of immunohistochemistry is performed for further  characterization. The neoplastic cells are positive for CD3, CD5, CD4,  and BCL2. They are negative for CD8, CD30 (<1%), BCL6, CD10, CD56, TdT,  CD34, CD20, cyclin D1, and CD23. The Ki67 proliferative rate is 80-90%.  EBV is negative by in situ hybridization.   Flow cytometric analysis demonstrates an atypical T-cell population (see  [id number]).   Together, the morphologic and immunophenotypic findings support the  diagnosis of a T-cell lymphoma, best classified as a peripheral T-cell  lymphoma, NOS.   All questions were answered. The patient knows to call the clinic with any problems, questions or concerns. I spent 30 minutes in the care of this patient including H and P, review of records, counseling and coordination of care. Reviewed external labs, records, discussed about treatment plan again, ordered referral and  procedure.    Benay Pike, MD 08/06/2020 3:54 PM

## 2020-08-18 ENCOUNTER — Other Ambulatory Visit (HOSPITAL_COMMUNITY): Payer: Medicare Other

## 2020-08-18 ENCOUNTER — Ambulatory Visit (HOSPITAL_COMMUNITY): Payer: Medicare Other

## 2020-08-18 ENCOUNTER — Other Ambulatory Visit: Payer: Self-pay | Admitting: Hematology and Oncology

## 2020-08-18 DIAGNOSIS — C8443 Peripheral T-cell lymphoma, not classified, intra-abdominal lymph nodes: Secondary | ICD-10-CM

## 2020-08-18 DIAGNOSIS — C8490 Mature T/NK-cell lymphomas, unspecified, unspecified site: Secondary | ICD-10-CM | POA: Insufficient documentation

## 2020-08-21 ENCOUNTER — Other Ambulatory Visit: Payer: Self-pay | Admitting: Radiology

## 2020-08-24 ENCOUNTER — Other Ambulatory Visit (HOSPITAL_COMMUNITY): Payer: Medicare Other

## 2020-08-24 ENCOUNTER — Other Ambulatory Visit: Payer: Self-pay

## 2020-08-24 ENCOUNTER — Ambulatory Visit (HOSPITAL_COMMUNITY)
Admission: RE | Admit: 2020-08-24 | Discharge: 2020-08-24 | Disposition: A | Discharge status: Home / Self Care | Payer: Medicare Other | Source: Ambulatory Visit | Attending: Hematology and Oncology | Admitting: Hematology and Oncology

## 2020-08-24 ENCOUNTER — Encounter (HOSPITAL_COMMUNITY): Payer: Self-pay

## 2020-08-24 DIAGNOSIS — M1990 Unspecified osteoarthritis, unspecified site: Secondary | ICD-10-CM | POA: Insufficient documentation

## 2020-08-24 DIAGNOSIS — Z7984 Long term (current) use of oral hypoglycemic drugs: Secondary | ICD-10-CM | POA: Diagnosis not present

## 2020-08-24 DIAGNOSIS — Z8041 Family history of malignant neoplasm of ovary: Secondary | ICD-10-CM | POA: Insufficient documentation

## 2020-08-24 DIAGNOSIS — Z7982 Long term (current) use of aspirin: Secondary | ICD-10-CM | POA: Diagnosis not present

## 2020-08-24 DIAGNOSIS — G4730 Sleep apnea, unspecified: Secondary | ICD-10-CM | POA: Insufficient documentation

## 2020-08-24 DIAGNOSIS — C8443 Peripheral T-cell lymphoma, not classified, intra-abdominal lymph nodes: Secondary | ICD-10-CM | POA: Diagnosis present

## 2020-08-24 DIAGNOSIS — E119 Type 2 diabetes mellitus without complications: Secondary | ICD-10-CM | POA: Insufficient documentation

## 2020-08-24 DIAGNOSIS — Z9049 Acquired absence of other specified parts of digestive tract: Secondary | ICD-10-CM | POA: Insufficient documentation

## 2020-08-24 LAB — CBC WITH DIFFERENTIAL/PLATELET
Abs Immature Granulocytes: 0.08 10*3/uL — ABNORMAL HIGH (ref 0.00–0.07)
Basophils Absolute: 0.1 10*3/uL (ref 0.0–0.1)
Basophils Relative: 1 %
Eosinophils Absolute: 0 10*3/uL (ref 0.0–0.5)
Eosinophils Relative: 1 %
HCT: 38 % (ref 36.0–46.0)
Hemoglobin: 12.2 g/dL (ref 12.0–15.0)
Immature Granulocytes: 1 %
Lymphocytes Relative: 31 %
Lymphs Abs: 1.9 10*3/uL (ref 0.7–4.0)
MCH: 28.8 pg (ref 26.0–34.0)
MCHC: 32.1 g/dL (ref 30.0–36.0)
MCV: 89.8 fL (ref 80.0–100.0)
Monocytes Absolute: 0.4 10*3/uL (ref 0.1–1.0)
Monocytes Relative: 7 %
Neutro Abs: 3.7 10*3/uL (ref 1.7–7.7)
Neutrophils Relative %: 59 %
Platelets: 221 10*3/uL (ref 150–400)
RBC: 4.23 MIL/uL (ref 3.87–5.11)
RDW: 16 % — ABNORMAL HIGH (ref 11.5–15.5)
WBC: 6.3 10*3/uL (ref 4.0–10.5)
nRBC: 0 % (ref 0.0–0.2)

## 2020-08-24 LAB — GLUCOSE, CAPILLARY: Glucose-Capillary: 131 mg/dL — ABNORMAL HIGH (ref 70–99)

## 2020-08-24 MED ORDER — NALOXONE HCL 0.4 MG/ML IJ SOLN
INTRAMUSCULAR | Status: AC
  Filled 2020-08-24: qty 1

## 2020-08-24 MED ORDER — FLUMAZENIL 0.5 MG/5ML IV SOLN
INTRAVENOUS | Status: AC
  Filled 2020-08-24: qty 5

## 2020-08-24 MED ORDER — FENTANYL CITRATE (PF) 100 MCG/2ML IJ SOLN
INTRAMUSCULAR | Status: AC | PRN
  Administered 2020-08-24 (×2): 50 ug via INTRAVENOUS

## 2020-08-24 MED ORDER — MIDAZOLAM HCL 2 MG/2ML IJ SOLN
INTRAMUSCULAR | Status: AC
  Filled 2020-08-24: qty 4

## 2020-08-24 MED ORDER — SODIUM CHLORIDE 0.9 % IV SOLN: INTRAVENOUS | Status: DC

## 2020-08-24 MED ORDER — LIDOCAINE HCL (PF) 1 % IJ SOLN
INTRAMUSCULAR | Status: AC | PRN
  Administered 2020-08-24: 10 mL via INTRADERMAL

## 2020-08-24 MED ORDER — MIDAZOLAM HCL 2 MG/2ML IJ SOLN
INTRAMUSCULAR | Status: AC | PRN
  Administered 2020-08-24 (×3): 1 mg via INTRAVENOUS

## 2020-08-24 MED ORDER — FENTANYL CITRATE (PF) 100 MCG/2ML IJ SOLN
INTRAMUSCULAR | Status: AC
  Filled 2020-08-24: qty 2

## 2020-08-24 NOTE — Consult Note (Signed)
Chief Complaint: Patient was seen in consultation today for CT-guided bone marrow biopsy  Referring Physician(s): Iruku,Praveena  Supervising Physician: Daryll Brod  Patient Status: [facility] - Out-pt  History of Present Illness: Abigail Houston is a 70 y.o. 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  Past Medical History:  Diagnosis Date  . Arthritis   . Cataract   . Diabetes mellitus without complication ([place])   . Sleep apnea    does not wear CPAP  . Snoring   . Thickened endometrium     Past Surgical History:  Procedure Laterality Date  . arm fracture  1984   left  . CESAREAN SECTION     one time  . EYE SURGERY  07/2017   right eye cataract surgery  . LAPAROSCOPY N/A 06/29/2020   Procedure: LAPAROSCOPY DIAGNOSTIC, SMALL  BOWEL RESECTION;  Surgeon: Clovis Riley, MD;  Location: [facility];  Service: General;  Laterality: N/A;  . LAPAROTOMY N/A 06/29/2020   Procedure: EXPLORATORY LAPAROTOMY;  Surgeon: Clovis Riley, MD;  Location: [place];  Service: General;  Laterality: N/A;  . varicose veins  11/2017   left leg    Allergies: Motrin [ibuprofen], Pravastatin, Ampicillin, and Penicillins  Medications: Prior to Admission medications   Medication Sig Start Date End Date Taking? Authorizing Provider  acetaminophen (TYLENOL) 325 MG tablet Take 2 tablets (650 mg total) by mouth every 6 (six) hours as needed for fever or mild pain. 07/03/20  Yes Meuth, Blaine Hamper, PA-C  aspirin EC 81 MG tablet Take 81 mg by mouth daily.   Yes [provider]  CALCIUM PO Take 1 tablet by mouth daily.   Yes [provider]  cholecalciferol (VITAMIN D) 1000 units tablet Take 1,000 Units by mouth daily.   Yes [provider]  ezetimibe (ZETIA) 10 MG tablet Take 10 mg by mouth at  bedtime.   Yes [provider]  ferrous sulfate 325 (65 FE) MG tablet Take 325 mg by mouth daily with breakfast.   Yes [provider]  metFORMIN (GLUCOPHAGE) 500 MG tablet Take 500 mg by mouth in the morning and at bedtime.   Yes [provider]  Multiple Vitamins-Minerals (CENTRUM SILVER PO) Take 1 tablet by mouth daily.   Yes [provider]  Omega-3 Fatty Acids (FISH OIL) 1000 MG CAPS Take 1 capsule by mouth daily.   Yes [provider]  oxyCODONE (OXY IR/ROXICODONE) 5 MG immediate release tablet Take 1 tablet (5 mg total) by mouth every 6 (six) hours as needed for severe pain. 07/03/20  Yes Meuth, Brooke A, PA-C  vitamin B-12 (CYANOCOBALAMIN) 1000 MCG tablet Take 1,000 mcg by mouth daily.   Yes [provider]  vitamin C (ASCORBIC ACID) 500 MG tablet Take 500 mg by mouth daily.   Yes [provider]  VITAMIN E PO Take 1 capsule by mouth daily.   Yes [provider]  zinc gluconate 50 MG tablet Take 50 mg by mouth daily.   Yes [provider]  furosemide (LASIX) 20 MG tablet Take 20 mg by mouth daily as needed for fluid or edema. 10/19/17   [provider]  ondansetron (ZOFRAN-ODT) 8 MG disintegrating tablet Take 8 mg by mouth every 8 (eight) hours as needed for nausea or vomiting. 06/23/20   [provider]  polyethylene  glycol (MIRALAX) 17 g packet Take 17 g by mouth daily as needed for mild constipation. 07/03/20   Meuth, Blaine Hamper, PA-C     Family History  Problem Relation Age of Onset  . Ovarian cancer Mother   . Diabetes Sister   . Heart disease Brother   . Colon cancer Neg Hx   . Esophageal cancer Neg Hx   . Stomach cancer Neg Hx   . Rectal cancer Neg Hx   . Colon polyps Neg Hx     Social History   Socioeconomic History  . Marital status: Single    Spouse name: Not on file  . Number of children: Not on file  . Years of education: Not on file  . Highest education level: Not on  file  Occupational History  . Not on file  Tobacco Use  . Smoking status: Never Smoker  . Smokeless tobacco: Never Used  Vaping Use  . Vaping Use: Never used  Substance and Sexual Activity  . Alcohol use: Yes    Alcohol/week: 1.0 standard drink    Types: 1 Cans of beer per week    Comment: occasional  . Drug use: No  . Sexual activity: Not Currently  Other Topics Concern  . Not on file  Social History Narrative  . Not on file   Social Determinants of Health   Financial Resource Strain: Not on file  Food Insecurity: Not on file  Transportation Needs: Not on file  Physical Activity: Not on file  Stress: Not on file  Social Connections: Not on file      Review of Systems currently denies fever, headache, chest pain, dyspnea, cough, abdominal pain, nausea, vomiting or bleeding.  She does have some occasional low back pain.  Vital Signs: BP 126/84   Pulse 94   Temp 98.1 F (36.7 C)   Resp 16   Ht $R'5\' 6"'BE$  (1.676 m)   Wt 188 lb (85.3 kg)   SpO2 100%   BMI 30.34 kg/m   Physical Exam awake, alert.  Chest clear to auscultation bilaterally.  Heart with regular rate and rhythm.  Abdomen soft, positive bowel sounds, currently nontender.  No lower extremity edema.  Imaging: ECHOCARDIOGRAM COMPLETE  Result Date: 08/06/2020    ECHOCARDIOGRAM REPORT   Patient Name:   Abigail Houston Date of Exam: 08/06/2020 Medical Rec #:  [id number]    Height:       66.0 in Accession #:    [id number]   Weight:       190.4 lb Date of Birth:  1950-08-24   BSA:          1.958 m Patient Age:    55 years     BP:           136/84 mmHg Patient Gender: F            HR:           71 bpm. Exam Location:  Outpatient Procedure: 2D Echo, 3D Echo, Cardiac Doppler, Color Doppler and Strain Analysis Indications:    Z51.11 Encounter for antineoplastic chemotheraphy  History:        Patient has no prior history of Echocardiogram examinations.                 Risk Factors:Diabetes and Sleep Apnea.  Sonographer:    Tiffany  Dance Referring Phys: [id number] [place]  1. Left ventricular ejection fraction, by estimation, is 55 to 60%. The left ventricle has normal  function. The left ventricle has no regional wall motion abnormalities. Left ventricular diastolic parameters were normal.  2. Right ventricular systolic function is normal. The right ventricular size is normal. There is normal pulmonary artery systolic pressure.  3. The mitral valve is normal in structure. Trivial mitral valve regurgitation. No evidence of mitral stenosis.  4. The aortic valve is normal in structure. Aortic valve regurgitation is not visualized. FINDINGS  Left Ventricle: Left ventricular ejection fraction, by estimation, is 55 to 60%. The left ventricle has normal function. The left ventricle has no regional wall motion abnormalities. The left ventricular internal cavity size was normal in size. There is  no left ventricular hypertrophy. Left ventricular diastolic parameters were normal. Right Ventricle: The right ventricular size is normal. No increase in right ventricular wall thickness. Right ventricular systolic function is normal. There is normal pulmonary artery systolic pressure. The tricuspid regurgitant velocity is 2.23 m/s, and  with an assumed right atrial pressure of 3 mmHg, the estimated right ventricular systolic pressure is 64.4 mmHg. Left Atrium: Left atrial size was normal in size. Right Atrium: Right atrial size was normal in size. Pericardium: There is no evidence of pericardial effusion. Mitral Valve: The mitral valve is normal in structure. Trivial mitral valve regurgitation. No evidence of mitral valve stenosis. Tricuspid Valve: The tricuspid valve is normal in structure. Tricuspid valve regurgitation is not demonstrated. Aortic Valve: The aortic valve is normal in structure. Aortic valve regurgitation is not visualized. Pulmonic Valve: The pulmonic valve was normal in structure. Pulmonic valve regurgitation is not  visualized. Aorta: The aortic root and ascending aorta are structurally normal, with no evidence of dilitation. IAS/Shunts: The atrial septum is grossly normal.  LEFT VENTRICLE PLAX 2D LVIDd:         4.30 cm  Diastology LVIDs:         3.30 cm  LV e' medial:    7.62 cm/s LV PW:         1.10 cm  LV E/e' medial:  9.7 LV IVS:        1.10 cm  LV e' lateral:   12.30 cm/s LVOT diam:     1.80 cm  LV E/e' lateral: 6.0 LV SV:         50 LV SV Index:   26 LVOT Area:     2.54 cm                          3D Volume EF:                         3D EF:        57 %                         LV EDV:       156 ml                         LV ESV:       67 ml                         LV SV:        88 ml RIGHT VENTRICLE            IVC RV Basal diam:  2.30 cm    IVC diam: 1.10 cm RV S prime:  9.36 cm/s TAPSE (M-mode): 2.0 cm LEFT ATRIUM             Index       RIGHT ATRIUM           Index LA diam:        4.00 cm 2.04 cm/m  RA Area:     14.00 cm LA Vol (A2C):   82.3 ml 42.02 ml/m RA Volume:   31.10 ml  15.88 ml/m LA Vol (A4C):   48.1 ml 24.56 ml/m LA Biplane Vol: 63.9 ml 32.63 ml/m  AORTIC VALVE LVOT Vmax:   95.80 cm/s LVOT Vmean:  68.100 cm/s LVOT VTI:    0.197 m  AORTA Ao Root diam: 3.20 cm Ao Asc diam:  3.60 cm MITRAL VALVE               TRICUSPID VALVE MV Area (PHT): 3.03 cm    TR Peak grad:   19.9 mmHg MV Decel Time: 250 msec    TR Vmax:        223.00 cm/s MV E velocity: 73.70 cm/s MV A velocity: 73.10 cm/s  SHUNTS MV E/A ratio:  1.01        Systemic VTI:  0.20 m                            Systemic Diam: 1.80 cm Mertie Moores MD Electronically signed by Mertie Moores MD Signature Date/Time: 08/06/2020/1:25:22 PM    Final     Labs:  CBC: Recent Labs    07/01/20 0026 07/02/20 0218 07/03/20 0052 07/08/20 1413  WBC 8.2 5.2 4.9 6.9  HGB 10.4* 10.8* 10.5* 11.7*  HCT 33.5* 32.7* 34.5* 37.4  PLT 165 PLATELET CLUMPS NOTED ON SMEAR, UNABLE TO ESTIMATE 166 217    COAGS: No results for input(s): INR, APTT in the last 8760  hours.  BMP: Recent Labs    07/01/20 0026 07/02/20 0218 07/03/20 0052 07/08/20 1413  NA 137 139 139 142  K 3.6 3.4* 3.8 3.5  CL 103 104 107 108  CO2 $Re'24 23 23 25  'msq$ GLUCOSE 107* 100* 124* 107*  BUN 6* <5* <5* 8  CALCIUM 8.7* 8.6* 8.5* 9.5  CREATININE 0.63 0.63 0.60 0.72  GFRNONAA >60 >60 >60 >60    LIVER FUNCTION TESTS: Recent Labs    06/27/20 1755 07/08/20 1413  BILITOT 0.7 0.4  AST 18 16  ALT 14 33  ALKPHOS 64 97  PROT <3.0* 7.9  ALBUMIN 3.9 3.9    TUMOR MARKERS: No results for input(s): AFPTM, CEA, CA199, CHROMGRNA in the last 8760 hours.  Assessment and Plan: 70 y.o. 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  All of the patient's questions were answered, patient is agreeable to proceed. Consent signed and in chart.     Thank you for this interesting consult.  I greatly enjoyed meeting GENNI BUSKE and look forward to participating in their care.  A copy of this report was sent to the requesting provider on this date.  Electronically Signed: D. Rowe Robert, PA-C 08/24/2020, 10:18 AM   I spent a total of  20 minutes  in face to face in clinical consultation, greater than 50% of which was counseling/coordinating care for CT-guided bone marrow biopsy

## 2020-08-24 NOTE — Discharge Instructions (Addendum)
For questions /concerns may call Interventional Radiology at [phone]  You may remove your bandaid and shower tomorrow afternoon    Bone Marrow Aspiration and Bone Marrow Biopsy, Adult, Care After This sheet gives you information about how to care for yourself after your procedure. Your health care provider may also give you more specific instructions. If you have problems or questions, contact your health care provider. What can I expect after the procedure? After the procedure, it is common to have:  Mild pain and tenderness.  Swelling.  Bruising. Follow these instructions at home: Puncture site care  Follow instructions from your health care provider about how to take care of the puncture site. Make sure you: ? Wash your hands with soap and water before and after you change your bandage (dressing). If soap and water are not available, use hand sanitizer. ? Change your dressing as told by your health care provider.  Check your puncture site every day for signs of infection. Check for: ? More redness, swelling, or pain. ? Fluid or blood. ? Warmth. ? Pus or a bad smell.   Activity  Return to your normal activities as told by your health care provider. Ask your health care provider what activities are safe for you.  Do not lift anything that is heavier than 10 lb (4.5 kg), or the limit that you are told, until your health care provider says that it is safe.  Do not drive for 24 hours if you were given a sedative during your procedure. General instructions  Take over-the-counter and prescription medicines only as told by your health care provider.  Do not take baths, swim, or use a hot tub until your health care provider approves. Ask your health care provider if you may take showers. You may only be allowed to take sponge baths.  If directed, put ice on the affected area. To do this: ? Put ice in a plastic bag. ? Place a towel between your skin and the bag. ? Leave  the ice on for 20 minutes, 2-3 times a day.  Keep all follow-up visits as told by your health care provider. This is important.   Contact a health care provider if:  Your pain is not controlled with medicine.  You have a fever.  You have more redness, swelling, or pain around the puncture site.  You have fluid or blood coming from the puncture site.  Your puncture site feels warm to the touch.  You have pus or a bad smell coming from the puncture site. Summary  After the procedure, it is common to have mild pain, tenderness, swelling, and bruising.  Follow instructions from your health care provider about how to take care of the puncture site and what activities are safe for you.  Take over-the-counter and prescription medicines only as told by your health care provider.  Contact a health care provider if you have any signs of infection, such as fluid or blood coming from the puncture site. This information is not intended to replace advice given to you by your health care provider. Make sure you discuss any questions you have with your health care provider.   Moderate Conscious Sedation, Adult, Care After This sheet gives you information about how to care for yourself after your procedure. Your health care provider may also give you more specific instructions. If you have problems or questions, contact your health care provider. What can I expect after the procedure? After the procedure, it is common  have:  Sleepiness for several hours.  Impaired judgment for several hours.  Difficulty with balance.  Vomiting if you eat too soon. Follow these instructions at home: For the time period you were told by your health care provider:  Rest.  Do not participate in activities where you could fall or become injured.  Do not drive or use machinery.  Do not drink alcohol.  Do not take sleeping pills or medicines that cause drowsiness.  Do not make important decisions or sign  legal documents.  Do not take care of children on your own.      Eating and drinking  Follow the diet recommended by your health care provider.  Drink enough fluid to keep your urine pale yellow.  If you vomit: ? Drink water, juice, or soup when you can drink without vomiting. ? Make sure you have little or no nausea before eating solid foods.   General instructions  Take over-the-counter and prescription medicines only as told by your health care provider.  Have a responsible adult stay with you for the time you are told. It is important to have someone help care for you until you are awake and alert.  Do not smoke.  Keep all follow-up visits as told by your health care provider. This is important. Contact a health care provider if:  You are still sleepy or having trouble with balance after 24 hours.  You feel light-headed.  You keep feeling nauseous or you keep vomiting.  You develop a rash.  You have a fever.  You have redness or swelling around the IV site. Get help right away if:  You have trouble breathing.  You have new-onset confusion at home. Summary  After the procedure, it is common to feel sleepy, have impaired judgment, or feel nauseous if you eat too soon.  Rest after you get home. Know the things you should not do after the procedure.  Follow the diet recommended by your health care provider and drink enough fluid to keep your urine pale yellow.  Get help right away if you have trouble breathing or new-onset confusion at home. This information is not intended to replace advice given to you by your health care provider. Make sure you discuss any questions you have with your health care provider. Document Revised: 09/20/2019 Document Reviewed: 04/18/2019 Elsevier Patient Education  2021 [employer]  

## 2020-08-24 NOTE — Procedures (Signed)
Interventional Radiology Procedure Note  Procedure: CT BM ASP AND CORE    Complications: None  Estimated Blood Loss:  MIN  Findings: 11 G CORE AND ASP    M. TREVOR Hermilo Dutter, MD    

## 2020-08-26 LAB — SURGICAL PATHOLOGY

## 2020-08-31 ENCOUNTER — Encounter (HOSPITAL_COMMUNITY): Payer: Self-pay | Admitting: Hematology and Oncology

## 2020-11-09 ENCOUNTER — Other Ambulatory Visit: Payer: Self-pay | Admitting: Family Medicine

## 2020-11-09 DIAGNOSIS — Z1231 Encounter for screening mammogram for malignant neoplasm of breast: Secondary | ICD-10-CM

## 2020-11-18 ENCOUNTER — Ambulatory Visit
Admission: RE | Admit: 2020-11-18 | Discharge: 2020-11-18 | Disposition: A | Discharge status: Home / Self Care | Payer: Medicare (Managed Care) | Source: Ambulatory Visit | Attending: Family Medicine | Admitting: Family Medicine

## 2020-11-18 ENCOUNTER — Other Ambulatory Visit: Payer: Self-pay

## 2020-11-18 DIAGNOSIS — Z1231 Encounter for screening mammogram for malignant neoplasm of breast: Secondary | ICD-10-CM

## 2021-10-21 ENCOUNTER — Other Ambulatory Visit: Payer: Self-pay | Admitting: Family Medicine

## 2021-10-21 DIAGNOSIS — Z1231 Encounter for screening mammogram for malignant neoplasm of breast: Secondary | ICD-10-CM

## 2021-11-19 ENCOUNTER — Ambulatory Visit: Payer: Medicare (Managed Care)

## 2021-11-22 ENCOUNTER — Ambulatory Visit
Admission: RE | Admit: 2021-11-22 | Discharge: 2021-11-22 | Disposition: A | Discharge status: Home / Self Care | Payer: Medicaid Other | Source: Ambulatory Visit | Attending: Family Medicine | Admitting: Family Medicine

## 2021-11-22 DIAGNOSIS — Z1231 Encounter for screening mammogram for malignant neoplasm of breast: Secondary | ICD-10-CM

## 2022-01-27 IMAGING — CT CT BIOPSY AND ASPIRATION BONE MARROW
1 of 2 series · 15 of 29 positions shown, 19 images · non-contrast
Comparison: none

INDICATION: Peripheral T-cell lymphoma staging

[Series 4: i-spiral 5.0 br40 · axial · 0.98mm/px · z∈[+1232,+1330]mm · 15 of 32 slices shown, 19 images]
[im 2/32  mediastinal]
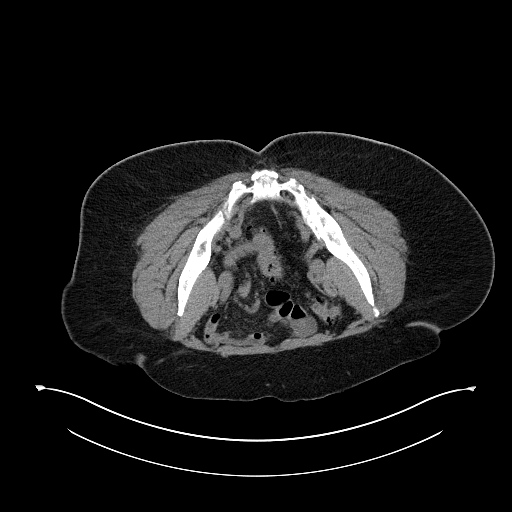
[im 2/32  lung]
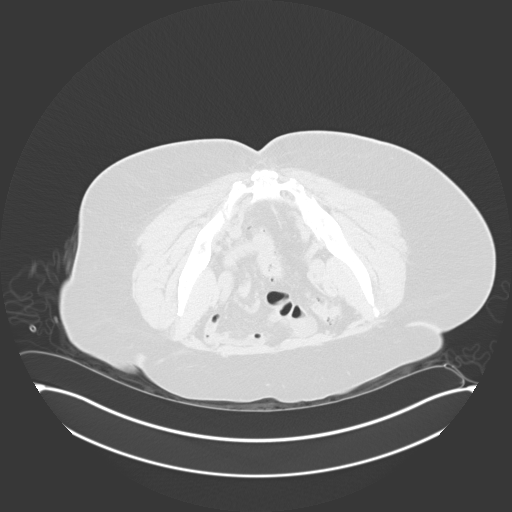
[im 4/32  lung]
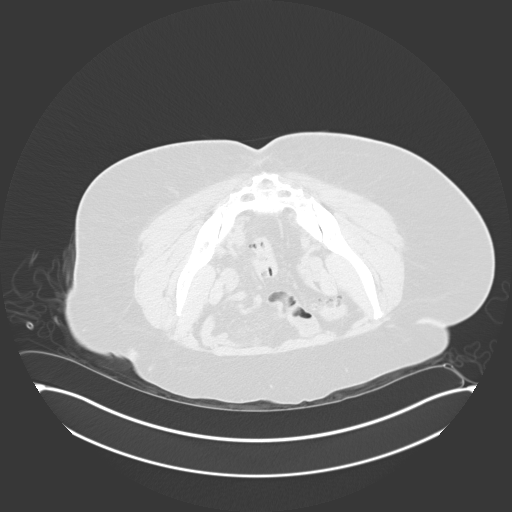
[im 7/32  lung]
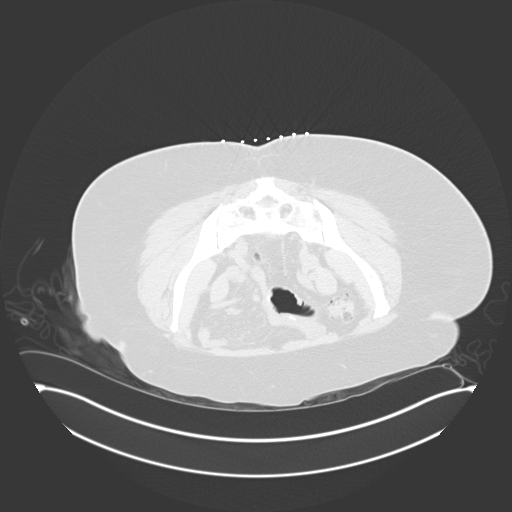
[im 8/32  lung]
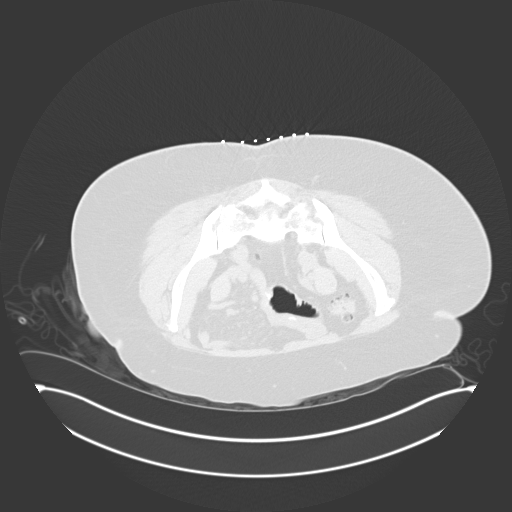
[im 10/32  mediastinal]
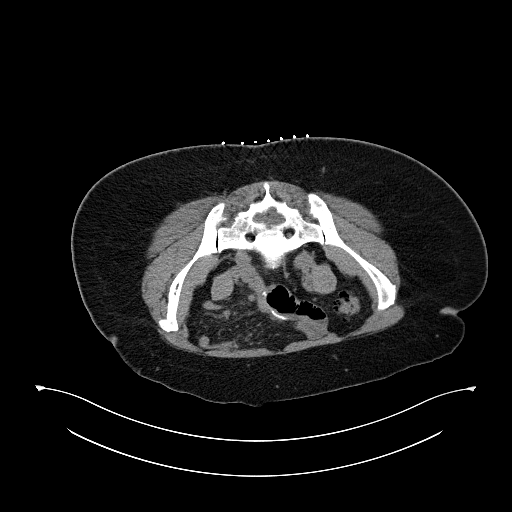
[im 10/32  lung]
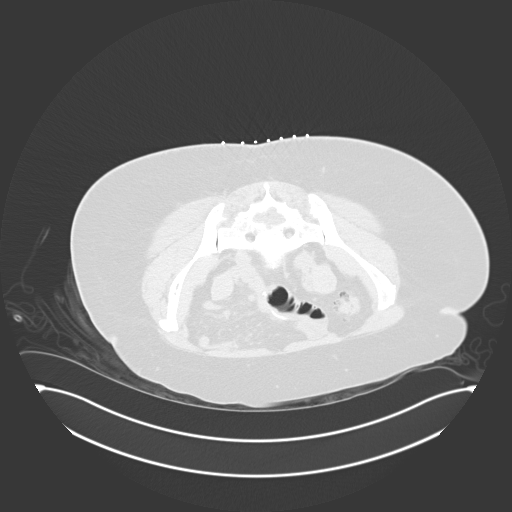
[im 13/32  lung]
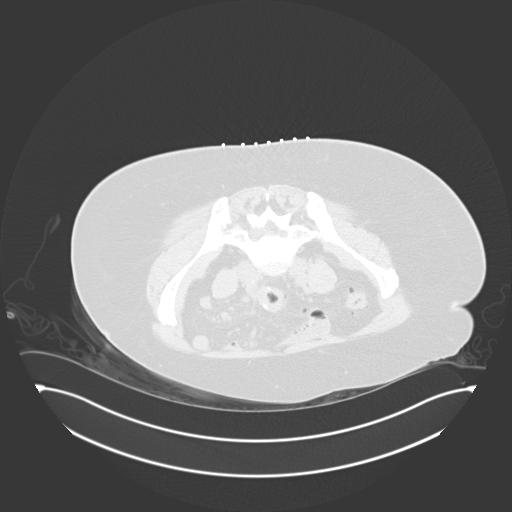
[im 14/32  lung]
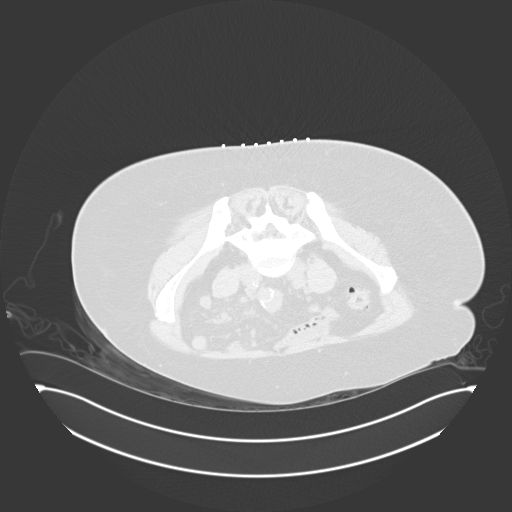
[im 16/32  lung]
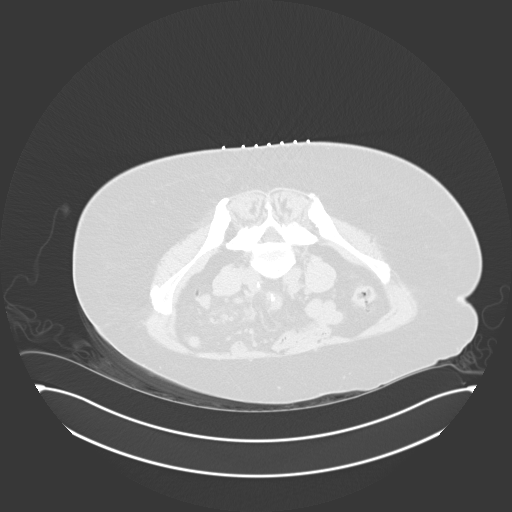
[im 18/32  mediastinal]
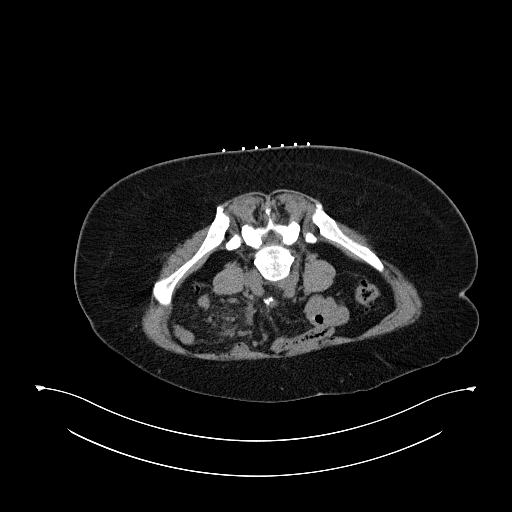
[im 18/32  lung]
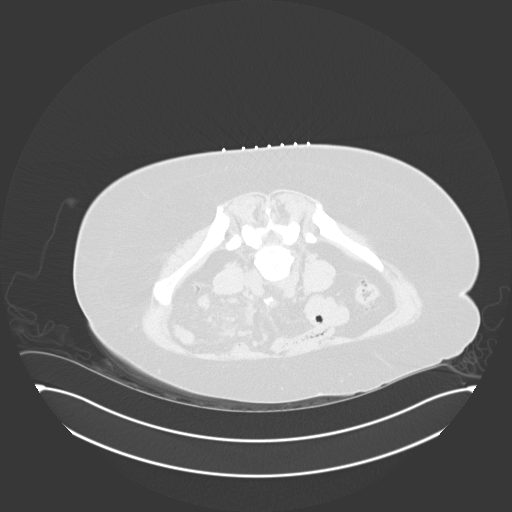
[im 19/32  lung]
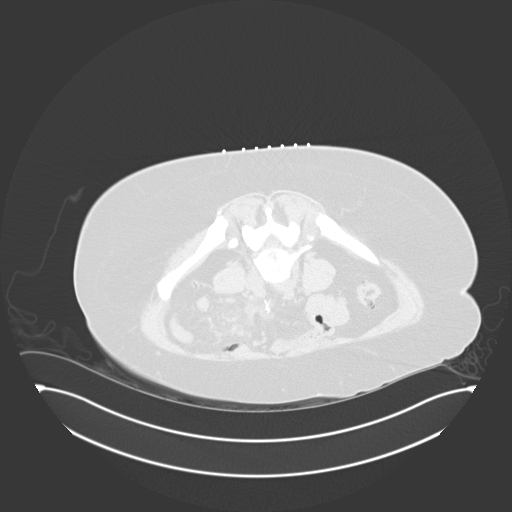
[im 22/32  lung]
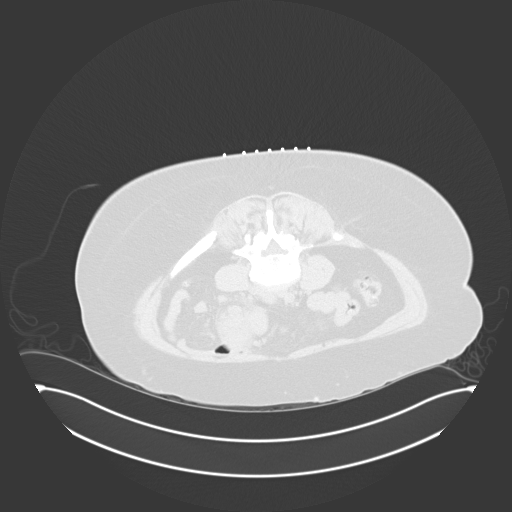
[im 24/32  lung]
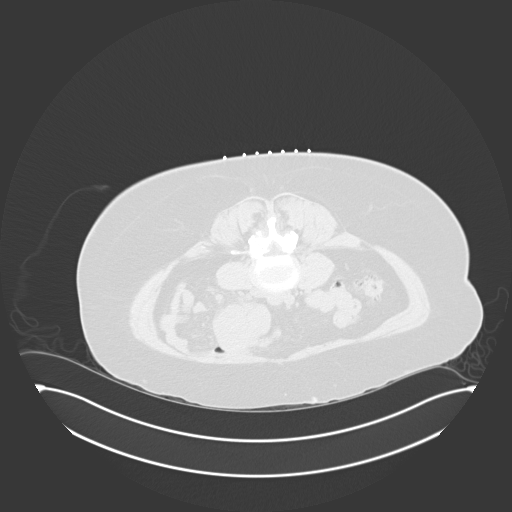
[im 25/32  mediastinal]
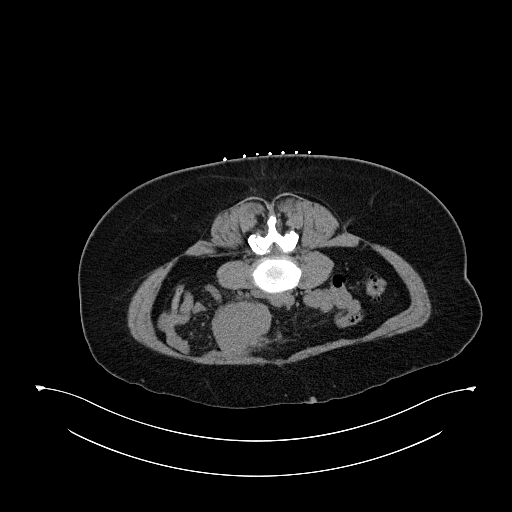
[im 25/32  lung]
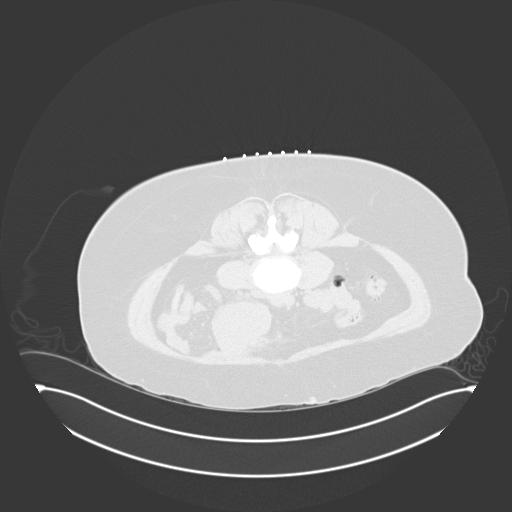
[im 28/32  lung]
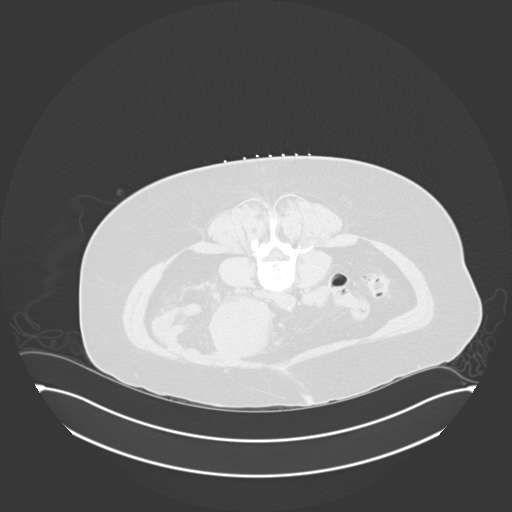
[im 30/32  lung]
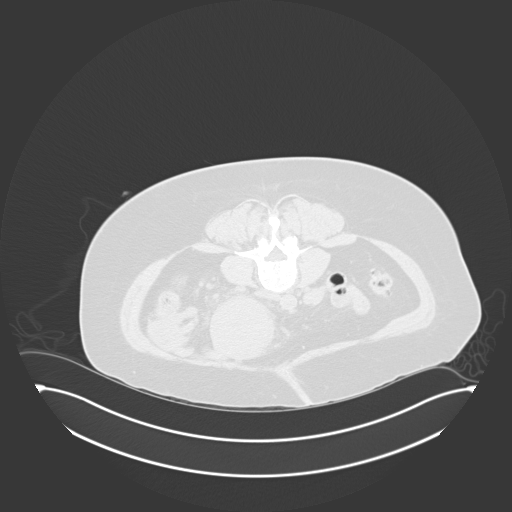

[15 of 29 positions shown; findings below may reference images not displayed]

EXAM:
CT GUIDED RIGHT ILIAC BONE MARROW ASPIRATION AND CORE BIOPSY

Radiologist:  Suy, [place]

Guidance:  CT

FLUOROSCOPY TIME:  Fluoroscopy Time: NONE.

MEDICATIONS:
1% lidocaine local

ANESTHESIA/SEDATION:
3.0 mg IV Versed; 100 mcg IV Fentanyl

Moderate Sedation Time:  10 minutes

The patient was continuously monitored during the procedure by the
interventional radiology nurse under my direct supervision.

CONTRAST:  None.

COMPLICATIONS:
None

PROCEDURE:
Informed consent was obtained from the patient following explanation
of the procedure, risks, benefits and alternatives. The patient
understands, agrees and consents for the procedure. All questions
were addressed. A time out was performed.

The patient was positioned prone and non-contrast localization CT
was performed of the pelvis to demonstrate the iliac marrow spaces.

Maximal barrier sterile technique utilized including caps, mask,
sterile gowns, sterile gloves, large sterile drape, hand hygiene,
and Betadine prep.

Under sterile conditions and local anesthesia, an 11 gauge coaxial
bone biopsy needle was advanced into the right iliac marrow space.
Needle position was confirmed with CT imaging. Initially, bone
marrow aspiration was performed. Next, the 11 gauge outer cannula
was utilized to obtain a right iliac bone marrow core biopsy. Needle
was removed. Hemostasis was obtained with compression. The patient
tolerated the procedure well. Samples were prepared with the
cytotechnologist. No immediate complications.
IMPRESSION: CT guided right iliac bone marrow aspiration and core biopsy.

## 2022-11-03 ENCOUNTER — Other Ambulatory Visit: Payer: Self-pay

## 2022-11-03 DIAGNOSIS — Z1231 Encounter for screening mammogram for malignant neoplasm of breast: Secondary | ICD-10-CM

## 2022-11-24 ENCOUNTER — Ambulatory Visit
Admission: RE | Admit: 2022-11-24 | Discharge: 2022-11-24 | Disposition: A | Discharge status: Home / Self Care | Payer: Medicare (Managed Care) | Source: Ambulatory Visit | Attending: Family Medicine | Admitting: Family Medicine

## 2022-11-24 DIAGNOSIS — Z1231 Encounter for screening mammogram for malignant neoplasm of breast: Secondary | ICD-10-CM

## 2023-11-01 ENCOUNTER — Other Ambulatory Visit: Payer: Self-pay | Admitting: Family Medicine

## 2023-11-01 DIAGNOSIS — Z1231 Encounter for screening mammogram for malignant neoplasm of breast: Secondary | ICD-10-CM

## 2023-11-27 ENCOUNTER — Ambulatory Visit: Payer: Medicare (Managed Care)

## 2023-12-01 ENCOUNTER — Ambulatory Visit: Payer: Medicare (Managed Care)

## 2023-12-12 ENCOUNTER — Ambulatory Visit
Admission: RE | Admit: 2023-12-12 | Discharge: 2023-12-12 | Disposition: A | Discharge status: Home / Self Care | Payer: Medicare (Managed Care) | Source: Ambulatory Visit | Attending: Family Medicine | Admitting: Family Medicine

## 2023-12-12 DIAGNOSIS — Z1231 Encounter for screening mammogram for malignant neoplasm of breast: Secondary | ICD-10-CM

## 2024-06-24 ENCOUNTER — Emergency Department (HOSPITAL_COMMUNITY)
Admission: EM | Admit: 2024-06-24 | Discharge: 2024-06-24 | Disposition: A | Discharge status: Home / Self Care | Attending: Emergency Medicine | Admitting: Emergency Medicine

## 2024-06-24 ENCOUNTER — Emergency Department (HOSPITAL_COMMUNITY)

## 2024-06-24 ENCOUNTER — Other Ambulatory Visit: Payer: Self-pay

## 2024-06-24 ENCOUNTER — Encounter (HOSPITAL_COMMUNITY): Payer: Self-pay

## 2024-06-24 DIAGNOSIS — Z7984 Long term (current) use of oral hypoglycemic drugs: Secondary | ICD-10-CM | POA: Diagnosis not present

## 2024-06-24 DIAGNOSIS — Z859 Personal history of malignant neoplasm, unspecified: Secondary | ICD-10-CM | POA: Diagnosis not present

## 2024-06-24 DIAGNOSIS — M25562 Pain in left knee: Secondary | ICD-10-CM | POA: Insufficient documentation

## 2024-06-24 DIAGNOSIS — Z7982 Long term (current) use of aspirin: Secondary | ICD-10-CM | POA: Diagnosis not present

## 2024-06-24 DIAGNOSIS — E119 Type 2 diabetes mellitus without complications: Secondary | ICD-10-CM | POA: Insufficient documentation

## 2024-06-24 LAB — CBG MONITORING, ED: Glucose-Capillary: 117 mg/dL — ABNORMAL HIGH (ref 70–99)

## 2024-06-24 MED ORDER — DEXAMETHASONE SOD PHOSPHATE PF 10 MG/ML IJ SOLN
10.0000 mg | Freq: Once | INTRAMUSCULAR | Status: AC
  Administered 2024-06-24: 10 mg via INTRAMUSCULAR
  Filled 2024-06-24: qty 1

## 2024-06-24 MED ORDER — PREDNISONE 10 MG (21) PO TBPK: ORAL_TABLET | Freq: Every day | ORAL | 0 refills | Status: AC

## 2024-06-24 NOTE — ED Provider Notes (Signed)
 " [facility] Provider Note   CSN: [id number] Arrival date & time: 06/24/24  1539     Patient presents with: Knee Pain   Abigail Houston is a 74 y.o. female with past medical history of diabetes, cancer 3 years ago, who presents emergency department for evaluation of left knee pain.  Patient reports that she has been in pain for over a week, and her PCP recommended she use topical Voltaren gel, but it is not helping.  Patient states she has been unable to work due to the pain.  She does report wearing a brace, but states it is difficult for her to ambulate.  Patient states she is unable to take NSAIDs due to an allergic reaction.  She also states she is scheduled to follow-up with an Ortho doctor tomorrow.    Knee Pain      Prior to Admission medications  Medication Sig Start Date End Date Taking? Authorizing Provider  predniSONE  (STERAPRED UNI-PAK 21 TAB) 10 MG (21) TBPK tablet Take by mouth daily. Take 6 tabs by mouth daily  for 2 days, then 5 tabs for 2 days, then 4 tabs for 2 days, then 3 tabs for 2 days, 2 tabs for 2 days, then 1 tab by mouth daily for 2 days 06/24/24  Yes Torey Regan, Marry RAMAN, PA-C  acetaminophen  (TYLENOL ) 325 MG tablet Take 2 tablets (650 mg total) by mouth every 6 (six) hours as needed for fever or mild pain. 07/03/20   Meuth, Lyle LABOR, PA-C  aspirin EC 81 MG tablet Take 81 mg by mouth daily.    [provider]  CALCIUM PO Take 1 tablet by mouth daily.    [provider]  cholecalciferol (VITAMIN D) 1000 units tablet Take 1,000 Units by mouth daily.    [provider]  ezetimibe (ZETIA) 10 MG tablet Take 10 mg by mouth at bedtime.    [provider]  ferrous sulfate 325 (65 FE) MG tablet Take 325 mg by mouth daily with breakfast.    [provider]  furosemide (LASIX) 20 MG tablet Take 20 mg by mouth daily as needed for fluid or edema. 10/19/17   [provider]  metFORMIN  (GLUCOPHAGE) 500 MG tablet Take 500 mg by mouth in the morning and at bedtime.    [provider]  Multiple Vitamins-Minerals (CENTRUM SILVER PO) Take 1 tablet by mouth daily.    [provider]  Omega-3 Fatty Acids (FISH OIL) 1000 MG CAPS Take 1 capsule by mouth daily.    [provider]  ondansetron  (ZOFRAN -ODT) 8 MG disintegrating tablet Take 8 mg by mouth every 8 (eight) hours as needed for nausea or vomiting. 06/23/20   [provider]  oxyCODONE  (OXY IR/ROXICODONE ) 5 MG immediate release tablet Take 1 tablet (5 mg total) by mouth every 6 (six) hours as needed for severe pain. 07/03/20   Meuth, Brooke A, PA-C  polyethylene glycol (MIRALAX ) 17 g packet Take 17 g by mouth daily as needed for mild constipation. 07/03/20   Meuth, Brooke A, PA-C  vitamin B-12 (CYANOCOBALAMIN) 1000 MCG tablet Take 1,000 mcg by mouth daily.    [provider]  vitamin C (ASCORBIC ACID) 500 MG tablet Take 500 mg by mouth daily.    [provider]  VITAMIN E PO Take 1 capsule by mouth daily.    [provider]  zinc gluconate 50 MG tablet Take 50 mg by mouth daily.  [provider]    Allergies: Motrin [ibuprofen], Pravastatin, Ampicillin, and Penicillins    Review of Systems  Musculoskeletal:  Positive for arthralgias.    Updated Vital Signs BP (!) 135/90 (BP Location: Left Arm)   Pulse 85   Temp 98.2 F (36.8 C)   Resp 17   Ht 5' 6 (1.676 m)   Wt 85.3 kg   SpO2 98%   BMI 30.35 kg/m   Physical Exam Vitals and nursing note reviewed.  Constitutional:      Appearance: Normal appearance. She is not ill-appearing.  Eyes:     General: No scleral icterus. Pulmonary:     Effort: Pulmonary effort is normal. No respiratory distress.  Musculoskeletal:        General: Tenderness present. No deformity.     Comments: Patient with tenderness to palpation on the left knee joint.  She is only able to bend her knee to about 20 degrees before  she experiences pain.  Sensation is intact bilaterally.  No obvious swelling noted.  Skin:    Coloration: Skin is not jaundiced.  Neurological:     General: No focal deficit present.     Mental Status: She is alert.  Psychiatric:        Mood and Affect: Mood normal.     (all labs ordered are listed, but only abnormal results are displayed) Labs Reviewed  CBG MONITORING, ED - Abnormal; Notable for the following components:      Result Value   Glucose-Capillary 117 (*)    All other components within normal limits    EKG: None  Radiology: DG Knee Complete 4 Views Left Result Date: 06/24/2024 EXAM: 4 OR MORE VIEW(S) XRAY OF THE LEFT KNEE 06/24/2024 05:39:00 PM COMPARISON: None available. CLINICAL HISTORY: left knee pain FINDINGS: BONES AND JOINTS: No acute fracture. No malalignment. No significant joint effusion. Mild medial compartment narrowing with marginal osteophytes. Moderate patellofemoral compartment narrowing with marginal osteophytes. SOFT TISSUES: Unremarkable. IMPRESSION: 1. No acute findings. 2. Mild osteoarthrosis. Electronically signed by: Greig Pique MD 06/24/2024 05:49 PM EST RP Workstation: [id number]    Procedures   Medications Ordered in the ED  dexamethasone  (DECADRON ) injection 10 mg (has no administration in time range)                                Medical Decision Making Risk Prescription drug management.   74 year old female who presents emergency department for evaluation of left knee pain.  Differential diagnosis: Osteoarthritis, septic arthritis, fracture, dislocation.  Patient's x-ray was negative for fracture or dislocation.  I personally viewed and interpreted these images and agree with the radiologist interpretation.  Low suspicion for septic arthritis as patient has not had any recent injection and is afebrile, and denies chills and bodyaches.  X-ray does read as mild osteoarthritis, and description of patient's pain does correlate with this.  I  did give her a dose of IM Decadron  while in the emergency department.  Patient's blood glucose was checked prior to administration of this medication, which was 117.  I also weighed the pros and cons of sending this patient home on a Medrol Dosepak for the next 6 days.  The patient does manage her blood glucose responsibly, checking her sugars throughout the day.  She reports compliance with her metformin medication.  Patient is not insulin-dependent at this time.  Therefore, I did send her home on a Medrol Dosepak.  She is  scheduled to follow-up with Ortho for her pain, tomorrow and I encouraged her to keep this appointment.  I also informed the patient she may take Tylenol  as needed for additional pain control.  Patient verbalized her understanding to this.  Her vital signs are stable.  Patient is appropriate for discharge at this time.    Final diagnoses:  Acute pain of left knee    ED Discharge Orders          Ordered    predniSONE  (STERAPRED UNI-PAK 21 TAB) 10 MG (21) TBPK tablet  Daily        06/24/24 1944               Vivaan Helseth S, PA-C 06/24/24 2005    Rogelia Jerilynn RAMAN, MD 07/05/24 1504  "

## 2024-06-24 NOTE — ED Triage Notes (Signed)
 Patient c/o knee pain since last Friday, no injury or trauma reported. PCP ordered voltaren gel which helped a little, she's wearing a brace which also helped a little but she states it still hurts really bad and she can't walk on it.

## 2024-06-24 NOTE — Discharge Instructions (Addendum)
 It was a pleasure taking care of you today. You were seen in the Emergency Department for evaluation of left knee pain. Your work-up was reassuring. Your x-ray showed no evidence of acute fracture or dislocation.  There were some mild degenerative changes, consistent with a mild osteoarthritis.  I did give you a dose of steroids while you are in the emergency department to treat the inflammation.  I also did send you home on a 6-day taper of steroids.  A known side effect of this medication is elevated blood sugars, so we will be even more important for you to routinely check your sugars. Refer to the attached documentation for further management of your symptoms.  Please keep your appointment with Ortho as scheduled for tomorrow.  You may also take Tylenol  as needed for pain management.  Please return to the ER if you experience chest pain, trouble breathing, intractable nausea/vomiting or any other life threatening illnesses.

## 2024-06-24 NOTE — ED Triage Notes (Signed)
 Patient states her left knee started hurting her a week ago. She reports she was ambulatory on both legs before it started hurting and now she is unable to put pressure on it. Denies any known injury.

## 2024-06-24 NOTE — ED Provider Triage Note (Signed)
 Emergency Medicine Provider Triage Evaluation Note  PIPPA HANIF , a 74 y.o. female  was evaluated in triage.  Pt complains of left knee pain.  Started on Friday and has been worsening, slight improvement with brace and Voltaren gel.  Denies infectious symptoms  Review of Systems  Positive: Left knee pain Negative:   Physical Exam  BP (!) 135/90 (BP Location: Left Arm)   Pulse 85   Temp 98.2 F (36.8 C)   Resp 17   Ht 5' 6 (1.676 m)   Wt 85.3 kg   SpO2 98%   BMI 30.35 kg/m  Gen:   Awake, no distress   Resp:  Normal effort  MSK:   Moves extremities without difficulty, left knee mildly tender to palpation Other:    Medical Decision Making  Medically screening exam initiated at 5:03 PM.  Appropriate orders placed.  ALEK PONCEDELEON was informed that the remainder of the evaluation will be completed by another provider, this initial triage assessment does not replace that evaluation, and the importance of remaining in the ED until their evaluation is complete.  Orders: Left knee x-ray   Janetta Terrall FALCON, PA-C 06/24/24 1703
